# Patient Record
Sex: Male | Born: 1950 | Race: White | Hispanic: No | Marital: Married | Smoking: Former smoker
Health system: Southern US, Community
[De-identification: ages and names within clinical notes are randomized; demographics above are authoritative.]

## PROBLEM LIST (undated history)

## (undated) DIAGNOSIS — C9511 Chronic leukemia of unspecified cell type, in remission: Secondary | ICD-10-CM

## (undated) DIAGNOSIS — I10 Essential (primary) hypertension: Secondary | ICD-10-CM

## (undated) DIAGNOSIS — H532 Diplopia: Secondary | ICD-10-CM

## (undated) DIAGNOSIS — K219 Gastro-esophageal reflux disease without esophagitis: Secondary | ICD-10-CM

## (undated) DIAGNOSIS — G709 Myoneural disorder, unspecified: Secondary | ICD-10-CM

## (undated) DIAGNOSIS — M1990 Unspecified osteoarthritis, unspecified site: Secondary | ICD-10-CM

## (undated) HISTORY — PX: HIP SURGERY: SHX245

## (undated) HISTORY — DX: Diplopia: H53.2

## (undated) HISTORY — PX: LYMPH NODE BIOPSY: SHX201

## (undated) HISTORY — PX: EYE SURGERY: SHX253

---

## 2012-09-10 HISTORY — PX: ROTATOR CUFF REPAIR: SHX139

## 2013-06-28 ENCOUNTER — Encounter (HOSPITAL_COMMUNITY): Payer: Self-pay | Admitting: Pharmacy Technician

## 2013-06-28 ENCOUNTER — Other Ambulatory Visit: Payer: Self-pay | Admitting: Neurosurgery

## 2013-06-29 ENCOUNTER — Encounter (HOSPITAL_COMMUNITY): Payer: Self-pay

## 2013-06-29 ENCOUNTER — Ambulatory Visit (HOSPITAL_COMMUNITY)
Admission: RE | Admit: 2013-06-29 | Discharge: 2013-06-29 | Disposition: A | Discharge status: Home / Self Care | Payer: BC Managed Care – PPO | Source: Ambulatory Visit | Attending: Neurosurgery | Admitting: Neurosurgery

## 2013-06-29 ENCOUNTER — Encounter (HOSPITAL_COMMUNITY)
Admission: RE | Admit: 2013-06-29 | Discharge: 2013-06-29 | Disposition: A | Discharge status: Home / Self Care | Payer: BC Managed Care – PPO | Source: Ambulatory Visit | Attending: Neurosurgery | Admitting: Neurosurgery

## 2013-06-29 DIAGNOSIS — Z01812 Encounter for preprocedural laboratory examination: Secondary | ICD-10-CM | POA: Insufficient documentation

## 2013-06-29 DIAGNOSIS — Z87891 Personal history of nicotine dependence: Secondary | ICD-10-CM | POA: Insufficient documentation

## 2013-06-29 DIAGNOSIS — Z01810 Encounter for preprocedural cardiovascular examination: Secondary | ICD-10-CM | POA: Insufficient documentation

## 2013-06-29 DIAGNOSIS — I10 Essential (primary) hypertension: Secondary | ICD-10-CM | POA: Insufficient documentation

## 2013-06-29 DIAGNOSIS — M5126 Other intervertebral disc displacement, lumbar region: Secondary | ICD-10-CM | POA: Insufficient documentation

## 2013-06-29 HISTORY — DX: Unspecified osteoarthritis, unspecified site: M19.90

## 2013-06-29 HISTORY — DX: Essential (primary) hypertension: I10

## 2013-06-29 HISTORY — DX: Gastro-esophageal reflux disease without esophagitis: K21.9

## 2013-06-29 HISTORY — DX: Chronic leukemia of unspecified cell type, in remission: C95.11

## 2013-06-29 HISTORY — DX: Myoneural disorder, unspecified: G70.9

## 2013-06-29 LAB — CBC
HCT: 44.2 % (ref 39.0–52.0)
MCHC: 35.5 g/dL (ref 30.0–36.0)
MCV: 89.3 fL (ref 78.0–100.0)
Platelets: 205 10*3/uL (ref 150–400)
RDW: 13.5 % (ref 11.5–15.5)
WBC: 41.3 10*3/uL — ABNORMAL HIGH (ref 4.0–10.5)

## 2013-06-29 LAB — BASIC METABOLIC PANEL
BUN: 27 mg/dL — ABNORMAL HIGH (ref 6–23)
Calcium: 9.7 mg/dL (ref 8.4–10.5)
Chloride: 103 mEq/L (ref 96–112)
Creatinine, Ser: 1.05 mg/dL (ref 0.50–1.35)
GFR calc Af Amer: 85 mL/min — ABNORMAL LOW (ref >90)
GFR calc non Af Amer: 74 mL/min — ABNORMAL LOW (ref >90)

## 2013-06-29 NOTE — Pre-Procedure Instructions (Signed)
DAIVON RAYOS  06/29/2013   Your procedure is scheduled on:  July 01, 2013 at 6:06 PM  Report to [facility] at 4:00 PM.  Call this number if you have problems the morning of surgery: [phone]   Remember:   Do not eat food or drink liquids after midnight.   Take these medicines the morning of surgery with A SIP OF WATER: cetirizine (ZYRTEC), omeprazole (PRILOSEC) Stop all Vitamins, Herbal medications, Aspirin and non-steroidals (Aleve, Motrin, Advil, Ibuprofen, Naproxen) as of today.     Do not wear jewelry, make-up or nail polish.  Do not wear lotions, powders, or perfumes. You may wear deodorant.  Do not shave 48 hours prior to surgery. Men may shave face and neck.  Do not bring valuables to the hospital.  [facility] is not responsible                   for any belongings or valuables.  Contacts, dentures or bridgework may not be worn into surgery.  Leave suitcase in the car. After surgery it may be brought to your room.  For patients admitted to the hospital, checkout time is 11:00 AM the day of  discharge.     Special Instructions: Shower using CHG 2 nights before surgery and the night before surgery.  If you shower the day of surgery use CHG.  Use special wash - you have one bottle of CHG for all showers.  You should use approximately 1/3 of the bottle for each shower.   Please read over the following fact sheets that you were given: Pain Booklet, Coughing and Deep Breathing, MRSA Information and Surgical Site Infection Prevention

## 2013-06-30 ENCOUNTER — Encounter (HOSPITAL_COMMUNITY): Payer: Self-pay

## 2013-06-30 NOTE — Progress Notes (Addendum)
Anesthesia chart review:  Patient is a 62 year old male scheduled for one level lumbar laminectomy/decompression microdiscectomy on 07/01/13 by Dr. Wynetta Emery.  History includes former smoker, HTN, arthritis, GERD, right rotator cuff repair 09/2012, LLE radiculopathy, CLL stage I. PCP is listed as Dr. Montey Hora. HEM-ONC is Dr. Ivor Reining.  EKG on 06/29/13 showed NSR, ST elevation, consider early repolarization, J point elevation.  Borderline ECG.  I contacted his PCP office and received comparison EKGs and a stress test from 2012.  His EKg from 09/06/10 also showed probable early repolarization.  Exercise stress test (read by Dr. Bonnielee Haff) from 01/14/11 showed functional capacity of 11.7 METS, hypertensive BP at rest with exaggerated response, no chest pain or EKG changes at high workload.  Baseline EKG then showed: "Normal sinus rhythm.  Minimal J point elevation inferolateral leads: normal variant early repolarization."   CXR on 06/29/13 showed no active lung disease.  Preoperative labs noted.  WBC 41.3 (called to [facility] at Dr. Lonie Peak office ~ 10 AM). K+ 5.4, Cr 1.05.  Meds include lisinopril.  I received comparison labs from Dr. Allyne Gee office.  Patient has known CLL, but his WBC on 05/05/13 was 17.5, but notes indicate that "it goes up and down with him."  H/H 14.9/42.1, K+ 4.5.  I couldn't reach patient at home, but Dr. Lonie Peak office did report that patient has received two epidural injections by Dr. Mariel Craft on 06/13/13 and 06/21/13.  I have faxed labs over for Dr. Allyne Gee to review and contact me if any concerns. I reviewed with anesthesiologist Dr. Katrinka Blazing who felt patient's leukocytosis in a patient with CLL would not necessarily prevent surgery (if otherwise no acute s/s infection) from an anesthesia standpoint, but that if Dr. Wynetta Emery had any concerns he should call and speak with Dr. Allyne Gee.  Vanessa notified.  Velna Ochs [facility]/Anesthesiology Phone [phone] 06/30/2013 4:26 PM  Addendum: 07/01/2013 1:23 PM I just received a phone call back from patient's Oncologist Dr. Allyne Gee.  He has reviewed labs.  I did notify him of patients steroid injections earlier this month which is possibly exacerbating his elevated WBC.  Dr. Allyne Gee feels labs results should not interfere with patient proceeding to the OR, as long as he appears "well" and without new symptoms from his baseline.  He would like to see patient within ~ one month following surgery.

## 2013-07-01 ENCOUNTER — Ambulatory Visit (HOSPITAL_COMMUNITY): Payer: BC Managed Care – PPO

## 2013-07-01 ENCOUNTER — Encounter (HOSPITAL_COMMUNITY)
Admission: RE | Disposition: A | Discharge status: Home / Self Care | Payer: Self-pay | Source: Ambulatory Visit | Attending: Neurosurgery

## 2013-07-01 ENCOUNTER — Inpatient Hospital Stay (HOSPITAL_COMMUNITY)
Admission: RE | Admit: 2013-07-01 | Discharge: 2013-07-02 | DRG: 757 | Disposition: A | Discharge status: Home / Self Care | Payer: BC Managed Care – PPO | Source: Ambulatory Visit | Attending: Neurosurgery | Admitting: Neurosurgery

## 2013-07-01 ENCOUNTER — Encounter (HOSPITAL_COMMUNITY): Payer: Self-pay | Admitting: Vascular Surgery

## 2013-07-01 ENCOUNTER — Ambulatory Visit (HOSPITAL_COMMUNITY): Payer: BC Managed Care – PPO | Admitting: Anesthesiology

## 2013-07-01 ENCOUNTER — Encounter (HOSPITAL_COMMUNITY): Payer: Self-pay | Admitting: *Deleted

## 2013-07-01 DIAGNOSIS — D809 Immunodeficiency with predominantly antibody defects, unspecified: Secondary | ICD-10-CM | POA: Diagnosis present

## 2013-07-01 DIAGNOSIS — C9511 Chronic leukemia of unspecified cell type, in remission: Secondary | ICD-10-CM | POA: Diagnosis present

## 2013-07-01 DIAGNOSIS — K219 Gastro-esophageal reflux disease without esophagitis: Secondary | ICD-10-CM | POA: Diagnosis present

## 2013-07-01 DIAGNOSIS — I10 Essential (primary) hypertension: Secondary | ICD-10-CM | POA: Diagnosis present

## 2013-07-01 DIAGNOSIS — M5126 Other intervertebral disc displacement, lumbar region: Principal | ICD-10-CM | POA: Diagnosis present

## 2013-07-01 DIAGNOSIS — Z87891 Personal history of nicotine dependence: Secondary | ICD-10-CM

## 2013-07-01 DIAGNOSIS — M129 Arthropathy, unspecified: Secondary | ICD-10-CM | POA: Diagnosis present

## 2013-07-01 HISTORY — PX: LUMBAR LAMINECTOMY/DECOMPRESSION MICRODISCECTOMY: SHX5026

## 2013-07-01 SURGERY — LUMBAR LAMINECTOMY/DECOMPRESSION MICRODISCECTOMY 1 LEVEL
Anesthesia: General | Site: Back | Laterality: Left | Wound class: Clean

## 2013-07-01 MED ORDER — THROMBIN 5000 UNITS EX SOLR
CUTANEOUS | Status: DC | PRN
  Administered 2013-07-01 (×2): 5000 [IU] via TOPICAL

## 2013-07-01 MED ORDER — SELENIUM 200 MCG PO CAPS
200.0000 ug | ORAL_CAPSULE | Freq: Every day | ORAL | Status: DC
Start: 2013-07-01 — End: 2013-07-01

## 2013-07-01 MED ORDER — ACETAMINOPHEN 325 MG PO TABS: 650.0000 mg | ORAL_TABLET | ORAL | Status: DC | PRN

## 2013-07-01 MED ORDER — NEOSTIGMINE METHYLSULFATE 1 MG/ML IJ SOLN
INTRAMUSCULAR | Status: DC | PRN
  Administered 2013-07-01: 4 mg via INTRAVENOUS

## 2013-07-01 MED ORDER — CYCLOBENZAPRINE HCL 10 MG PO TABS
10.0000 mg | ORAL_TABLET | Freq: Three times a day (TID) | ORAL | Status: DC | PRN
  Administered 2013-07-02: 10 mg via ORAL
  Filled 2013-07-01: qty 1

## 2013-07-01 MED ORDER — ALUM & MAG HYDROXIDE-SIMETH 200-200-20 MG/5ML PO SUSP
30.0000 mL | Freq: Four times a day (QID) | ORAL | Status: DC | PRN
  Administered 2013-07-02 (×2): 30 mL via ORAL
  Filled 2013-07-01 (×2): qty 30

## 2013-07-01 MED ORDER — FENTANYL CITRATE 0.05 MG/ML IJ SOLN
INTRAMUSCULAR | Status: DC | PRN
  Administered 2013-07-01: 250 ug via INTRAVENOUS
  Administered 2013-07-01: 100 ug via INTRAVENOUS
  Administered 2013-07-01: 50 ug via INTRAVENOUS

## 2013-07-01 MED ORDER — BUPIVACAINE HCL (PF) 0.25 % IJ SOLN
INTRAMUSCULAR | Status: DC | PRN
  Administered 2013-07-01: 8 mL

## 2013-07-01 MED ORDER — LIDOCAINE-EPINEPHRINE 1 %-1:100000 IJ SOLN
INTRAMUSCULAR | Status: DC | PRN
  Administered 2013-07-01: 8 mL via INTRADERMAL

## 2013-07-01 MED ORDER — PANTOPRAZOLE SODIUM 40 MG PO TBEC
40.0000 mg | DELAYED_RELEASE_TABLET | Freq: Every day | ORAL | Status: DC
  Administered 2013-07-01: 40 mg via ORAL
  Filled 2013-07-01 (×2): qty 1

## 2013-07-01 MED ORDER — ONDANSETRON HCL 4 MG/2ML IJ SOLN
INTRAMUSCULAR | Status: AC
  Filled 2013-07-01: qty 2

## 2013-07-01 MED ORDER — LACTATED RINGERS IV SOLN
INTRAVENOUS | Status: DC | PRN
  Administered 2013-07-01 (×2): via INTRAVENOUS

## 2013-07-01 MED ORDER — GLYCOPYRROLATE 0.2 MG/ML IJ SOLN
INTRAMUSCULAR | Status: DC | PRN
  Administered 2013-07-01: .8 mg via INTRAVENOUS

## 2013-07-01 MED ORDER — CEFAZOLIN SODIUM-DEXTROSE 2-3 GM-% IV SOLR
INTRAVENOUS | Status: AC
  Administered 2013-07-01: 2 g via INTRAVENOUS
  Filled 2013-07-01: qty 50

## 2013-07-01 MED ORDER — OXYCODONE HCL 5 MG PO TABS: 5.0000 mg | ORAL_TABLET | Freq: Once | ORAL | Status: DC | PRN

## 2013-07-01 MED ORDER — OXYCODONE HCL 5 MG/5ML PO SOLN: 5.0000 mg | Freq: Once | ORAL | Status: DC | PRN

## 2013-07-01 MED ORDER — DICLOFENAC SODIUM 75 MG PO TBEC
75.0000 mg | DELAYED_RELEASE_TABLET | Freq: Two times a day (BID) | ORAL | Status: DC | PRN
  Administered 2013-07-02: 75 mg via ORAL
  Filled 2013-07-01: qty 1

## 2013-07-01 MED ORDER — ONDANSETRON HCL 4 MG/2ML IJ SOLN
INTRAMUSCULAR | Status: DC | PRN
  Administered 2013-07-01: 4 mg via INTRAVENOUS

## 2013-07-01 MED ORDER — SODIUM CHLORIDE 0.9 % IV SOLN: 250.0000 mL | INTRAVENOUS | Status: DC

## 2013-07-01 MED ORDER — ADULT MULTIVITAMIN W/MINERALS CH
1.0000 | ORAL_TABLET | Freq: Every day | ORAL | Status: DC
  Administered 2013-07-01 – 2013-07-02 (×2): 1 via ORAL
  Filled 2013-07-01 (×2): qty 1

## 2013-07-01 MED ORDER — PROPOFOL 10 MG/ML IV BOLUS
INTRAVENOUS | Status: DC | PRN
  Administered 2013-07-01: 150 mg via INTRAVENOUS

## 2013-07-01 MED ORDER — SODIUM CHLORIDE 0.9 % IR SOLN
Status: DC | PRN
  Administered 2013-07-01: 17:00:00

## 2013-07-01 MED ORDER — HYDROMORPHONE HCL PF 1 MG/ML IJ SOLN
0.5000 mg | INTRAMUSCULAR | Status: DC | PRN
  Administered 2013-07-02: 1 mg via INTRAVENOUS
  Filled 2013-07-01: qty 1

## 2013-07-01 MED ORDER — OXYCODONE-ACETAMINOPHEN 5-325 MG PO TABS
ORAL_TABLET | ORAL | Status: AC
  Filled 2013-07-01: qty 2

## 2013-07-01 MED ORDER — SODIUM CHLORIDE 0.9 % IJ SOLN: 3.0000 mL | INTRAMUSCULAR | Status: DC | PRN

## 2013-07-01 MED ORDER — LISINOPRIL 10 MG PO TABS
10.0000 mg | ORAL_TABLET | Freq: Every day | ORAL | Status: DC
  Administered 2013-07-01 – 2013-07-02 (×2): 10 mg via ORAL
  Filled 2013-07-01 (×2): qty 1

## 2013-07-01 MED ORDER — HYDROMORPHONE HCL PF 1 MG/ML IJ SOLN
INTRAMUSCULAR | Status: AC
  Filled 2013-07-01: qty 1

## 2013-07-01 MED ORDER — HYDROMORPHONE HCL PF 1 MG/ML IJ SOLN: 0.2500 mg | INTRAMUSCULAR | Status: DC | PRN

## 2013-07-01 MED ORDER — PROMETHAZINE HCL 25 MG/ML IJ SOLN
INTRAMUSCULAR | Status: AC
  Administered 2013-07-01: 12.5 mg
  Filled 2013-07-01: qty 1

## 2013-07-01 MED ORDER — LORATADINE 10 MG PO TABS
10.0000 mg | ORAL_TABLET | Freq: Every day | ORAL | Status: DC
  Administered 2013-07-01 – 2013-07-02 (×2): 10 mg via ORAL
  Filled 2013-07-01 (×2): qty 1

## 2013-07-01 MED ORDER — PHENOL 1.4 % MT LIQD
1.0000 | OROMUCOSAL | Status: DC | PRN
  Administered 2013-07-01: 1 via OROMUCOSAL
  Filled 2013-07-01: qty 177

## 2013-07-01 MED ORDER — CYCLOBENZAPRINE HCL 10 MG PO TABS
ORAL_TABLET | ORAL | Status: AC
  Filled 2013-07-01: qty 1

## 2013-07-01 MED ORDER — ARTIFICIAL TEARS OP OINT
TOPICAL_OINTMENT | OPHTHALMIC | Status: DC | PRN
  Administered 2013-07-01: 1 via OPHTHALMIC

## 2013-07-01 MED ORDER — SODIUM CHLORIDE 0.9 % IJ SOLN: 3.0000 mL | Freq: Two times a day (BID) | INTRAMUSCULAR | Status: DC

## 2013-07-01 MED ORDER — MENTHOL 3 MG MT LOZG
1.0000 | LOZENGE | OROMUCOSAL | Status: DC | PRN
  Administered 2013-07-01: 3 mg via ORAL
  Filled 2013-07-01: qty 9

## 2013-07-01 MED ORDER — OXYCODONE-ACETAMINOPHEN 5-325 MG PO TABS: 1.0000 | ORAL_TABLET | ORAL | Status: DC | PRN

## 2013-07-01 MED ORDER — HEMOSTATIC AGENTS (NO CHARGE) OPTIME
TOPICAL | Status: DC | PRN
  Administered 2013-07-01: 1 via TOPICAL

## 2013-07-01 MED ORDER — LIDOCAINE HCL (CARDIAC) 20 MG/ML IV SOLN
INTRAVENOUS | Status: DC | PRN
  Administered 2013-07-01: 50 mg via INTRAVENOUS

## 2013-07-01 MED ORDER — SELENIUM 50 MCG PO TABS
200.0000 ug | ORAL_TABLET | Freq: Every day | ORAL | Status: DC
  Administered 2013-07-02: 200 ug via ORAL
  Filled 2013-07-01: qty 4

## 2013-07-01 MED ORDER — ROCURONIUM BROMIDE 100 MG/10ML IV SOLN
INTRAVENOUS | Status: DC | PRN
  Administered 2013-07-01: 50 mg via INTRAVENOUS

## 2013-07-01 MED ORDER — PROMETHAZINE HCL 25 MG/ML IJ SOLN: 6.2500 mg | INTRAMUSCULAR | Status: DC | PRN

## 2013-07-01 MED ORDER — ONDANSETRON HCL 4 MG/2ML IJ SOLN: 4.0000 mg | INTRAMUSCULAR | Status: DC | PRN

## 2013-07-01 MED ORDER — HYDROCODONE-ACETAMINOPHEN 10-325 MG PO TABS
1.0000 | ORAL_TABLET | Freq: Three times a day (TID) | ORAL | Status: DC | PRN
  Administered 2013-07-02 (×2): 1 via ORAL
  Filled 2013-07-01 (×2): qty 1

## 2013-07-01 MED ORDER — LACTATED RINGERS IV SOLN
INTRAVENOUS | Status: DC
  Administered 2013-07-01: 50 mL/h via INTRAVENOUS

## 2013-07-01 MED ORDER — 0.9 % SODIUM CHLORIDE (POUR BTL) OPTIME
TOPICAL | Status: DC | PRN
  Administered 2013-07-01: 1000 mL

## 2013-07-01 MED ORDER — CEFAZOLIN SODIUM 1-5 GM-% IV SOLN
1.0000 g | Freq: Three times a day (TID) | INTRAVENOUS | Status: AC
  Administered 2013-07-01 – 2013-07-02 (×2): 1 g via INTRAVENOUS
  Filled 2013-07-01 (×3): qty 50

## 2013-07-01 MED ORDER — DOCUSATE SODIUM 100 MG PO CAPS
100.0000 mg | ORAL_CAPSULE | Freq: Two times a day (BID) | ORAL | Status: DC
  Administered 2013-07-01 – 2013-07-02 (×2): 100 mg via ORAL
  Filled 2013-07-01 (×2): qty 1

## 2013-07-01 MED ORDER — ACETAMINOPHEN 650 MG RE SUPP: 650.0000 mg | RECTAL | Status: DC | PRN

## 2013-07-01 MED ORDER — PROPOFOL 10 MG/ML IV BOLUS: INTRAVENOUS | Status: DC | PRN

## 2013-07-01 SURGICAL SUPPLY — 51 items
BAG DECANTER FOR FLEXI CONT (MISCELLANEOUS) ×2 IMPLANT
BENZOIN TINCTURE PRP APPL 2/3 (GAUZE/BANDAGES/DRESSINGS) ×2 IMPLANT
BLADE SURG 11 STRL SS (BLADE) ×2 IMPLANT
BLADE SURG ROTATE 9660 (MISCELLANEOUS) IMPLANT
BRUSH SCRUB EZ PLAIN DRY (MISCELLANEOUS) ×2 IMPLANT
BUR MATCHSTICK NEURO 3.0 LAGG (BURR) ×2 IMPLANT
BUR PRECISION FLUTE 6.0 (BURR) ×2 IMPLANT
CANISTER SUCTION 2500CC (MISCELLANEOUS) ×2 IMPLANT
CLOTH BEACON ORANGE TIMEOUT ST (SAFETY) ×2 IMPLANT
CONT SPEC 4OZ CLIKSEAL STRL BL (MISCELLANEOUS) ×2 IMPLANT
DECANTER SPIKE VIAL GLASS SM (MISCELLANEOUS) ×2 IMPLANT
DERMABOND ADVANCED (GAUZE/BANDAGES/DRESSINGS) ×1
DERMABOND ADVANCED .7 DNX12 (GAUZE/BANDAGES/DRESSINGS) ×1 IMPLANT
DRAPE LAPAROTOMY 100X72X124 (DRAPES) ×2 IMPLANT
DRAPE MICROSCOPE ZEISS OPMI (DRAPES) ×2 IMPLANT
DRAPE POUCH INSTRU U-SHP 10X18 (DRAPES) ×2 IMPLANT
DRAPE PROXIMA HALF (DRAPES) IMPLANT
DRAPE SURG 17X23 STRL (DRAPES) ×2 IMPLANT
DRSG OPSITE 4X5.5 SM (GAUZE/BANDAGES/DRESSINGS) ×2 IMPLANT
DRSG OPSITE POSTOP 4X6 (GAUZE/BANDAGES/DRESSINGS) ×2 IMPLANT
DURAPREP 26ML APPLICATOR (WOUND CARE) ×2 IMPLANT
ELECT REM PT RETURN 9FT ADLT (ELECTROSURGICAL) ×2
ELECTRODE REM PT RTRN 9FT ADLT (ELECTROSURGICAL) ×1 IMPLANT
GAUZE SPONGE 4X4 16PLY XRAY LF (GAUZE/BANDAGES/DRESSINGS) IMPLANT
GLOVE BIO SURGEON STRL SZ8 (GLOVE) ×2 IMPLANT
GLOVE EXAM NITRILE LRG STRL (GLOVE) IMPLANT
GLOVE EXAM NITRILE MD LF STRL (GLOVE) IMPLANT
GLOVE EXAM NITRILE XL STR (GLOVE) IMPLANT
GLOVE EXAM NITRILE XS STR PU (GLOVE) IMPLANT
GLOVE INDICATOR 8.5 STRL (GLOVE) ×2 IMPLANT
GOWN BRE IMP SLV AUR LG STRL (GOWN DISPOSABLE) ×2 IMPLANT
GOWN BRE IMP SLV AUR XL STRL (GOWN DISPOSABLE) ×4 IMPLANT
GOWN STRL REIN 2XL LVL4 (GOWN DISPOSABLE) IMPLANT
KIT BASIN OR (CUSTOM PROCEDURE TRAY) ×2 IMPLANT
KIT ROOM TURNOVER OR (KITS) ×2 IMPLANT
NEEDLE HYPO 22GX1.5 SAFETY (NEEDLE) ×2 IMPLANT
NEEDLE SPNL 22GX3.5 QUINCKE BK (NEEDLE) ×2 IMPLANT
NS IRRIG 1000ML POUR BTL (IV SOLUTION) ×2 IMPLANT
PACK LAMINECTOMY NEURO (CUSTOM PROCEDURE TRAY) ×2 IMPLANT
RUBBERBAND STERILE (MISCELLANEOUS) ×4 IMPLANT
SPONGE GAUZE 4X4 12PLY (GAUZE/BANDAGES/DRESSINGS) ×2 IMPLANT
SPONGE SURGIFOAM ABS GEL SZ50 (HEMOSTASIS) ×2 IMPLANT
STRIP CLOSURE SKIN 1/2X4 (GAUZE/BANDAGES/DRESSINGS) ×2 IMPLANT
SUT VIC AB 0 CT1 18XCR BRD8 (SUTURE) ×1 IMPLANT
SUT VIC AB 0 CT1 8-18 (SUTURE) ×1
SUT VIC AB 2-0 CT1 18 (SUTURE) ×2 IMPLANT
SUT VICRYL 4-0 PS2 18IN ABS (SUTURE) ×2 IMPLANT
SYR 20ML ECCENTRIC (SYRINGE) ×2 IMPLANT
TOWEL OR 17X24 6PK STRL BLUE (TOWEL DISPOSABLE) ×2 IMPLANT
TOWEL OR 17X26 10 PK STRL BLUE (TOWEL DISPOSABLE) ×2 IMPLANT
WATER STERILE IRR 1000ML POUR (IV SOLUTION) ×2 IMPLANT

## 2013-07-01 NOTE — Transfer of Care (Signed)
Immediate Anesthesia Transfer of Care Note  Patient: Grant Carter  Procedure(s) Performed: Procedure(s) with comments: LUMBAR LAMINECTOMY/DECOMPRESSION MICRODISCECTOMY 1 LEVEL (Left) - Lumbar Laminectomy and Discectomy Lumbar Two-Three   Patient Location: PACU  Anesthesia Type:General  Level of Consciousness: awake and alert   Airway & Oxygen Therapy: Patient Spontanous Breathing and Patient connected to nasal cannula oxygen  Post-op Assessment: Report given to PACU RN and Post -op Vital signs reviewed and stable  Post vital signs: Reviewed and stable  Complications: No apparent anesthesia complications

## 2013-07-01 NOTE — Op Note (Signed)
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  EBL: Minimal  History of present illness: patient is a very pleasant 62 year old gentleman who presented with acute onset of left leg pain and weakness probably has quads iliopsoas consistent with an L3 radiculopathy. Workup with a large disc herniation L2-3 with a large free fragment migrated caudally behind the L3 vertebral body displacing the L3 nerve at its pedicle. Due to patient's clinical exam consistent with significant weakness in the L3 nerve distribution as well as failure conservative treatment imaging findings I recommended laminectomy microscope at that level I extensively went over the risks and benefits of the operation with him as well as perioperative course expectations alternatives of surgery and he understands and agrees to proceed forward.  Operative procedure: Patient brought into the or was induced and general anesthesia positioned prone on the Wilson frame his back was prepped and draped in routine sterile fashion preoperative localizing appropriate level so after infiltration of 10 cc lidocaine with epi a midline incision was made and Bovie light cautery was used to gas exchange tissues subperiosteal dissections care lamina of L2 and L3 on the left interoperative x-ray confirmed the appropriate level so a high-speed drill was drilled down the medial facet complex and dressed L2 and suppressant lamina of L3. Laminotomy was begun with a 20 minute Kerrison punch. The ligament was identified and removed piece of fashion exposing the thecal sac. And the microscope illumination the L3 nerve was identified and L3 pedicles identified under biting the medial gutter  allowed identification of the medial border of the L3 pedicle and to nerve hook and working underneath the L3 nerve several large free fragments disc removed we removed medial to the pedicle and inferior. Her after removal of several large fragments testing the disc space this was cleaned up to 2 rongeurs Epstein curettes additional large fracture removed from the lateral compartment and the disc space but again the discectomy there is no further stenosis on thecal sac or L3 nerve root the foramen easily excepting a coronary dilator and hockey-stick. Wounds and copiously irrigated meticulous in space was maintained Gelfoam was overlaid top of the dura the muscle fascia pressure layers with after Vicryl and the skin was closed running 4 subcuticular. Benzoin and Steri-Strips were applied patient recovered in stable condition. At the end of case on it counts sponge counts were correct.

## 2013-07-01 NOTE — H&P (Signed)
Grant Carter is an 62 y.o. male.   Chief Complaint: Back and left leg pain HPI: Patient is a very pleasant 65 year old son who has had severe back and pain rate as left leg down his anterior quad with numbness tingling same distribution and weakness and proximal leg. The pain is getting better however the numbness and weakness has progressed. Workup has revealed a very large disc herniation with a free fragment migrating caudally displacing the L3 nerve against the L3 pedicle. And due to patient's clinical exam a progressive weakness MRI findings and failure conservative treatment I recommended lumbar laminectomy microdiscectomy I extensively reviewed the risks and benefits of the operation the patient as well as therapy course expectations about alternatives of surgery and he understood and agreed to proceed forward to  Past Medical History  Diagnosis Date  . Hypertension     treated for fluctuating BP  . GERD (gastroesophageal reflux disease)   . Neuromuscular disorder     nerve pain, numbness in left leg/foot  . Arthritis   . 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 Ivor Reining)    Past Surgical History  Procedure Laterality Date  . Hip surgery      as a child  . Rotator cuff repair Right 09/2012  . Eye surgery Left     cataract  . Lymph node biopsy Right     History reviewed. No pertinent family history. Social History:  reports that he has quit smoking. He has never used smokeless tobacco. He reports that  drinks alcohol. He reports that he does not use illicit drugs.  Allergies: No Known Allergies  Medications Prior to Admission  Medication Sig Dispense Refill  . cetirizine (ZYRTEC) 10 MG tablet Take 10 mg by mouth daily.      . diclofenac (VOLTAREN) 75 MG EC tablet Take 75 mg by mouth 2 (two) times daily as needed (pain).      Marland Kitchen HYDROcodone-acetaminophen (NORCO) 10-325 MG per tablet Take 1 tablet by mouth 3 (three) times daily as needed  for pain.      Marland Kitchen lisinopril (PRINIVIL,ZESTRIL) 10 MG tablet Take 10 mg by mouth daily.      . Multiple Vitamin (MULTIVITAMIN WITH MINERALS) TABS tablet Take 1 tablet by mouth daily.      Marland Kitchen omeprazole (PRILOSEC) 40 MG capsule Take 40 mg by mouth daily as needed (heartburn).      . Selenium 200 MCG CAPS Take 200 mcg by mouth daily.        No results found for this or any previous visit (from the past 48 hour(s)). No results found.  Review of Systems  Constitutional: Negative.   HENT: Negative.   Eyes: Negative.   Respiratory: Negative.   Cardiovascular: Negative.   Gastrointestinal: Negative.   Genitourinary: Negative.   Musculoskeletal: Positive for myalgias and back pain.  Skin: Negative.   Neurological: Positive for tingling and sensory change.  Endo/Heme/Allergies: Negative.   Psychiatric/Behavioral: Negative.     Blood pressure 184/97, pulse 67, temperature 97.9 F (36.6 C), temperature source Oral, resp. rate 20, SpO2 98.00%. Physical Exam  Constitutional: He is oriented to person, place, and time. He appears well-developed and well-nourished.  Eyes: Pupils are equal, round, and reactive to light.  Neck: Normal range of motion.  Cardiovascular: Normal rate and regular rhythm.   Respiratory: Effort normal.  GI: Soft.  Musculoskeletal: Normal range of motion.  Neurological: He is alert and oriented to person, place,  and time. GCS eye subscore is 4. GCS verbal subscore is 5. GCS motor subscore is 6.  Reflex Scores:      Patellar reflexes are 0 on the right side and 0 on the left side.      Achilles reflexes are 0 on the right side and 0 on the left side. Strength in his right lower extremity is 5 out of 5 in his iliopsoas, quads, hip she's, gaseous, and EHL. Straight the [place] left lower extremity has weakness in his quadriceps and hip flexors at about 4/5 in her left leg distally he is 5 out of 5     Assessment/Plan 62 year old presents for lumbar laminectomy  microdiscectomy L2-3 left  Grant Carter P 07/01/2013, 4:39 PM

## 2013-07-01 NOTE — Anesthesia Preprocedure Evaluation (Addendum)
Anesthesia Evaluation  Patient identified by MRN, date of birth, ID band Patient awake    Reviewed: Allergy & Precautions, H&P , NPO status , Patient's Chart, lab work & pertinent test results  History of Anesthesia Complications Negative for: history of anesthetic complications  Airway Mallampati: II TM Distance: >3 FB Neck ROM: Full    Dental  (+) Teeth Intact and Dental Advisory Given   Pulmonary neg pulmonary ROS,    Pulmonary exam normal       Cardiovascular hypertension, Pt. on medications  Exercise stress test (read by Dr. Bonnielee Haff) from 01/14/11 showed functional capacity of 11.7 METS, hypertensive BP at rest with exaggerated response, no chest pain or EKG changes at high workload.  Baseline EKG then showed: "Normal sinus rhythm.  Minimal J point elevation inferolateral leads: normal variant early repolarization."     Neuro/Psych negative psych ROS   GI/Hepatic Neg liver ROS, GERD-  Medicated,  Endo/Other  negative endocrine ROS  Renal/GU negative Renal ROS     Musculoskeletal negative musculoskeletal ROS (+)   Abdominal   Peds  Hematology  (+) Blood dyscrasia, , CLL in remission   Anesthesia Other Findings   Reproductive/Obstetrics                         Anesthesia Physical Anesthesia Plan  ASA: II  Anesthesia Plan: General   Post-op Pain Management:    Induction: Intravenous  Airway Management Planned: Oral ETT  Additional Equipment:   Intra-op Plan:   Post-operative Plan: Extubation in OR  Informed Consent: I have reviewed the patients History and Physical, chart, labs and discussed the procedure including the risks, benefits and alternatives for the proposed anesthesia with the patient or authorized representative who has indicated his/her understanding and acceptance.   Dental advisory given  Plan Discussed with: CRNA, Anesthesiologist and  Surgeon  Anesthesia Plan Comments:         Anesthesia Quick Evaluation

## 2013-07-01 NOTE — Preoperative (Signed)
Beta Blockers   Reason not to administer Beta Blockers:Not Applicable 

## 2013-07-01 NOTE — Plan of Care (Signed)
Problem: Consults Goal: Diagnosis - Spinal Surgery Outcome: Completed/Met Date Met:  07/01/13 Lumbar Laminectomy (Complex)

## 2013-07-01 NOTE — Anesthesia Postprocedure Evaluation (Signed)
  Anesthesia Post-op Note  Patient: Grant Carter  Procedure(s) Performed: Procedure(s) with comments: LUMBAR LAMINECTOMY/DECOMPRESSION MICRODISCECTOMY 1 LEVEL (Left) - Lumbar Laminectomy and Discectomy Lumbar Two-Three   Patient Location: PACU  Anesthesia Type:General  Level of Consciousness: awake and alert   Airway and Oxygen Therapy: Patient Spontanous Breathing  Post-op Pain: mild  Post-op Assessment: Post-op Vital signs reviewed, Patient's Cardiovascular Status Stable, Respiratory Function Stable, Patent Airway, No signs of Nausea or vomiting and Pain level controlled  Post-op Vital Signs: stable  Complications: No apparent anesthesia complications

## 2013-07-01 NOTE — Anesthesia Procedure Notes (Signed)
Procedure Name: Intubation Date/Time: 07/01/2013 4:48 PM Performed by: Gwenyth Allegra Pre-anesthesia Checklist: Patient identified, Timeout performed, Emergency Drugs available, Suction available and Patient being monitored Patient Re-evaluated:Patient Re-evaluated prior to inductionOxygen Delivery Method: Circle system utilized Preoxygenation: Pre-oxygenation with 100% oxygen Intubation Type: IV induction Ventilation: Mask ventilation without difficulty and Oral airway inserted - appropriate to patient size Laryngoscope Size: 4 Grade View: Grade I Tube type: Oral Tube size: 8.0 mm Number of attempts: 1 Airway Equipment and Method: Stylet Placement Confirmation: ETT inserted through vocal cords under direct vision,  breath sounds checked- equal and bilateral and positive ETCO2 Secured at: 22 cm Tube secured with: Tape Dental Injury: Teeth and Oropharynx as per pre-operative assessment

## 2013-07-02 MED ORDER — TAMSULOSIN HCL 0.4 MG PO CAPS
0.4000 mg | ORAL_CAPSULE | Freq: Every morning | ORAL | Status: DC
  Administered 2013-07-02: 0.4 mg via ORAL
  Filled 2013-07-02: qty 1

## 2013-07-02 NOTE — Discharge Summary (Signed)
  Physician Discharge Summary  Patient ID: Grant Carter MRN: [id number] DOB/AGE: 63-Aug-1952 63 y.o.  Admit date: 07/01/2013 Discharge date: 07/02/2013  Admission Diagnoses: Left L3 radiculopathy from large ruptured disc L2-3 left  Discharge Diagnoses: Same Active Problems:   * No active hospital problems. *   Discharged Condition: good  Hospital Course: Patient was admitted to the hospital underwent the aforementioned procedure patient did very well recovered in the floor on the floor he was convalescing well ambulating but having some difficulty voiding. Should he be able to start voiding later on today he can be discharged otherwise he'll be observed with an in and out catheterization and possible placement of a Foley catheter. I have initiated Flomax.  Consults: Significant Diagnostic Studies: Treatments: L2-3 laminectomy and discectomy Discharge Exam: Blood pressure 139/84, pulse 65, temperature 98.6 F (37 C), temperature source Oral, resp. rate 18, SpO2 99.00%. Strength out of 5 wound clean dry  Disposition: Home     Medication List         cetirizine 10 MG tablet  Commonly known as:  ZYRTEC  Take 10 mg by mouth daily.     diclofenac 75 MG EC tablet  Commonly known as:  VOLTAREN  Take 75 mg by mouth 2 (two) times daily as needed (pain).     HYDROcodone-acetaminophen 10-325 MG per tablet  Commonly known as:  NORCO  Take 1 tablet by mouth 3 (three) times daily as needed for pain.     lisinopril 10 MG tablet  Commonly known as:  PRINIVIL,ZESTRIL  Take 10 mg by mouth daily.     multivitamin with minerals Tabs tablet  Take 1 tablet by mouth daily.     omeprazole 40 MG capsule  Commonly known as:  PRILOSEC  Take 40 mg by mouth daily as needed (heartburn).     Selenium 200 MCG Caps  Take 200 mcg by mouth daily.           Follow-up Information   Follow up with [facility] P, MD.   Specialty:  Neurosurgery   Contact information:   [address]  [address] [place] [address] [phone]       Signed: Ollie Esty P 07/02/2013, 7:56 AM

## 2013-07-02 NOTE — Progress Notes (Signed)
Pt transferred to 4N, report given to [place], Charity fundraiser. Rema Fendt, RN

## 2013-07-02 NOTE — Progress Notes (Signed)
Subjective: Patient reports Overall is doing well no leg pain still has some numbness he is ambulating and may be feels a little bit stronger in the leg.  Objective: Vital signs in last 24 hours: Temp:  [97.3 F (36.3 C)-98.6 F (37 C)] 98.6 F (37 C) (08/23 0400) Pulse Rate:  [65-76] 65 (08/23 0400) Resp:  [9-22] 18 (08/23 0400) BP: (136-184)/(68-97) 139/84 mmHg (08/23 0400) SpO2:  [92 %-100 %] 99 % (08/23 0400)  Intake/Output from previous day: 08/22 0701 - 08/23 0700 In: 1500 [I.V.:1500] Out: 1525 [Urine:1300; Blood:225] Intake/Output this shift:    Decreased sensation L3 strength 5 out of 5 wound clean and dry  Lab Results:  Recent Labs  06/29/13 1013  WBC 41.3*  HGB 15.7  HCT 44.2  PLT 205   BMET  Recent Labs  06/29/13 1006  NA 137  K 5.4*  CL 103  CO2 26  GLUCOSE 100*  BUN 27*  CREATININE 1.05  CALCIUM 9.7    Studies/Results: Dg Lumbar Spine 2-3 Views  07/01/2013   *RADIOLOGY REPORT*  Clinical Data: Multiple lumbar disc herniations.  OPERATIVE LUMBAR SPINE - 2-3 VIEW  Comparison: MRI lumbar spine 06/25/2013 [facility].  Findings: The same numbering scheme will be utilized as on the prior MRI, with the last lumbar segment designated L5.  Images were submitted for interpretation post-operatively.  Image labeled #1 obtained at 1705 hours demonstrates a localizer needle adjacent to the L3 spinous process.  Image labeled #2 at 1709 hours demonstrates tissue spreaders with a localizing device directed toward the L2-3 disc space, located posterior to the upper endplate of L2.  IMPRESSION: L2-3 localized intraoperatively.   Original Report Authenticated By: Hulan Saas, M.D.    Assessment/Plan: Posterior day 1 from a laminectomy discectomy doing fairly well. No leg pain no new numbness or tingling stable for discharge except he is not voiding it so will observe until about 10:00 and another opportunity void we'll initiate Flomax. Should he be unable to  void we'll transfer him to 4 N. 4 observation and once his voiding he'll be stable for discharge.  LOS: 1 day     Jhair Witherington P 07/02/2013, 7:53 AM

## 2013-07-02 NOTE — Plan of Care (Signed)
Problem: Discharge Progression Outcomes Goal: Discharge plan in place and appropriate Outcome: Completed/Met Date Met:  07/02/13 Patient to follow up with Dr. Wynetta Emery in next few weeks.  Will call on Monday for appointment. Goal: Pain controlled with appropriate interventions Outcome: Completed/Met Date Met:  07/02/13 Patient verbalizing relief with current medication regimen. Goal: Hemodynamically stable Outcome: Completed/Met Date Met:  07/02/13 Vitals within normal limits. Goal: Incision without S/S infection Outcome: Completed/Met Date Met:  07/02/13 Incision is free from s/s of infection. Goal: Ambulates without assistance Outcome: Completed/Met Date Met:  07/02/13 Patient able to ambulate freely with minimal standby assist.

## 2013-07-04 ENCOUNTER — Encounter (HOSPITAL_COMMUNITY): Payer: Self-pay | Admitting: Neurosurgery

## 2013-12-02 ENCOUNTER — Other Ambulatory Visit: Payer: Self-pay | Admitting: Neurosurgery

## 2013-12-08 ENCOUNTER — Encounter (HOSPITAL_COMMUNITY): Payer: Self-pay | Admitting: Pharmacy Technician

## 2013-12-12 ENCOUNTER — Encounter (HOSPITAL_COMMUNITY): Payer: Self-pay

## 2013-12-12 ENCOUNTER — Encounter (HOSPITAL_COMMUNITY)
Admission: RE | Admit: 2013-12-12 | Discharge: 2013-12-12 | Disposition: A | Discharge status: Home / Self Care | Payer: BC Managed Care – PPO | Source: Ambulatory Visit | Attending: Neurosurgery | Admitting: Neurosurgery

## 2013-12-12 ENCOUNTER — Other Ambulatory Visit (HOSPITAL_COMMUNITY): Payer: Self-pay | Admitting: *Deleted

## 2013-12-12 DIAGNOSIS — Z01812 Encounter for preprocedural laboratory examination: Secondary | ICD-10-CM | POA: Diagnosis not present

## 2013-12-12 DIAGNOSIS — M5126 Other intervertebral disc displacement, lumbar region: Secondary | ICD-10-CM | POA: Insufficient documentation

## 2013-12-12 LAB — CBC
HCT: 43 % (ref 39.0–52.0)
HEMOGLOBIN: 15.4 g/dL (ref 13.0–17.0)
MCH: 31.6 pg (ref 26.0–34.0)
MCHC: 35.8 g/dL (ref 30.0–36.0)
MCV: 88.1 fL (ref 78.0–100.0)
Platelets: 176 10*3/uL (ref 150–400)
RBC: 4.88 MIL/uL (ref 4.22–5.81)
RDW: 12.9 % (ref 11.5–15.5)
WBC: 14 10*3/uL — ABNORMAL HIGH (ref 4.0–10.5)

## 2013-12-12 LAB — BASIC METABOLIC PANEL
BUN: 16 mg/dL (ref 6–23)
CHLORIDE: 105 meq/L (ref 96–112)
CO2: 26 mEq/L (ref 19–32)
Calcium: 9.5 mg/dL (ref 8.4–10.5)
Creatinine, Ser: 1.07 mg/dL (ref 0.50–1.35)
GFR calc Af Amer: 83 mL/min — ABNORMAL LOW (ref >90)
GFR, EST NON AFRICAN AMERICAN: 71 mL/min — AB (ref >90)
GLUCOSE: 99 mg/dL (ref 70–99)
POTASSIUM: 4.9 meq/L (ref 3.7–5.3)
SODIUM: 144 meq/L (ref 137–147)

## 2013-12-12 LAB — SURGICAL PCR SCREEN
MRSA, PCR: NEGATIVE
Staphylococcus aureus: NEGATIVE

## 2013-12-12 NOTE — Progress Notes (Signed)
12/12/13 1023  OBSTRUCTIVE SLEEP APNEA  Have you ever been diagnosed with sleep apnea through a sleep study? No  Do you snore loudly (loud enough to be heard through closed doors)?  0  Do you often feel tired, fatigued, or sleepy during the daytime? 1  Has anyone observed you stop breathing during your sleep? 0  Do you have, or are you being treated for high blood pressure? 1  BMI more than 35 kg/m2? 0  Age over 64 years old? 1  Neck circumference greater than 40 cm/18 inches? (17)  Gender: 1  Obstructive Sleep Apnea Score 4  Score 4 or greater  Results sent to PCP

## 2013-12-12 NOTE — Pre-Procedure Instructions (Signed)
Grant Carter  12/12/2013   Your procedure is scheduled on:  Friday, December 16, 2013 at 7:30 AM.   Report to [facility] Entrance "A" Admitting Office at 5:30 AM.   Call this number if you have problems the morning of surgery: [phone]   Remember:   Do not eat food or drink liquids after midnight Thursday, 12/15/13.   Take these medicines the morning of surgery with A SIP OF WATER: cetirizine (ZYRTEC),  HYDROcodone-acetaminophen (NORCO) - if needed, omeprazole (PRILOSEC) - if needed.  Stop Vitamins, Selenium and Voltaren (Diclofenac) as of today. Do not take any NSAIDS (Ibuprofen, Aleve) prior to surgery.    Do not wear jewelry.  Do not wear lotions, powders, or cologne. You may wear deodorant.  Men may shave face and neck.  Do not bring valuables to the hospital.  [facility] is not responsible                  for any belongings or valuables.               Contacts, dentures or bridgework may not be worn into surgery.  Leave suitcase in the car. After surgery it may be brought to your room.  For patients admitted to the hospital, discharge time is determined by your                treatment team.               Special Instructions: Shower using the CHG soap the night prior to your surgery and again the morning of your surgery.    Please read over the following fact sheets that you were given: Pain Booklet, Coughing and Deep Breathing, MRSA Information and Surgical Site Infection Prevention

## 2013-12-12 NOTE — Progress Notes (Signed)
Pre-op orders not signed in EPIC. Called Dr. Windy Carina office and left message with Lorriane Shire.

## 2013-12-15 MED ORDER — CEFAZOLIN SODIUM-DEXTROSE 2-3 GM-% IV SOLR
2.0000 g | INTRAVENOUS | Status: AC
  Administered 2013-12-16: 2 g via INTRAVENOUS
  Filled 2013-12-15: qty 50

## 2013-12-16 ENCOUNTER — Ambulatory Visit (HOSPITAL_COMMUNITY): Payer: BC Managed Care – PPO

## 2013-12-16 ENCOUNTER — Observation Stay (HOSPITAL_COMMUNITY)
Admission: RE | Admit: 2013-12-16 | Discharge: 2013-12-16 | Disposition: A | Discharge status: Home / Self Care | Payer: BC Managed Care – PPO | Source: Ambulatory Visit | Attending: Neurosurgery | Admitting: Neurosurgery

## 2013-12-16 ENCOUNTER — Encounter (HOSPITAL_COMMUNITY): Payer: BC Managed Care – PPO | Admitting: Anesthesiology

## 2013-12-16 ENCOUNTER — Ambulatory Visit (HOSPITAL_COMMUNITY): Payer: BC Managed Care – PPO | Admitting: Anesthesiology

## 2013-12-16 ENCOUNTER — Encounter (HOSPITAL_COMMUNITY)
Admission: RE | Disposition: A | Discharge status: Home / Self Care | Payer: Self-pay | Source: Ambulatory Visit | Attending: Neurosurgery

## 2013-12-16 ENCOUNTER — Encounter (HOSPITAL_COMMUNITY): Payer: Self-pay | Admitting: *Deleted

## 2013-12-16 DIAGNOSIS — I10 Essential (primary) hypertension: Secondary | ICD-10-CM | POA: Insufficient documentation

## 2013-12-16 DIAGNOSIS — M5126 Other intervertebral disc displacement, lumbar region: Principal | ICD-10-CM | POA: Diagnosis present

## 2013-12-16 DIAGNOSIS — C9511 Chronic leukemia of unspecified cell type, in remission: Secondary | ICD-10-CM | POA: Insufficient documentation

## 2013-12-16 DIAGNOSIS — Z79899 Other long term (current) drug therapy: Secondary | ICD-10-CM | POA: Insufficient documentation

## 2013-12-16 DIAGNOSIS — K219 Gastro-esophageal reflux disease without esophagitis: Secondary | ICD-10-CM | POA: Insufficient documentation

## 2013-12-16 DIAGNOSIS — G709 Myoneural disorder, unspecified: Secondary | ICD-10-CM | POA: Insufficient documentation

## 2013-12-16 HISTORY — PX: LUMBAR LAMINECTOMY/DECOMPRESSION MICRODISCECTOMY: SHX5026

## 2013-12-16 SURGERY — LUMBAR LAMINECTOMY/DECOMPRESSION MICRODISCECTOMY 1 LEVEL
Anesthesia: General | Site: Spine Lumbar | Laterality: Left

## 2013-12-16 MED ORDER — EPHEDRINE SULFATE 50 MG/ML IJ SOLN
INTRAMUSCULAR | Status: DC | PRN
  Administered 2013-12-16 (×2): 10 mg via INTRAVENOUS

## 2013-12-16 MED ORDER — HEMOSTATIC AGENTS (NO CHARGE) OPTIME
TOPICAL | Status: DC | PRN
  Administered 2013-12-16: 1 via TOPICAL

## 2013-12-16 MED ORDER — ROCURONIUM BROMIDE 50 MG/5ML IV SOLN
INTRAVENOUS | Status: AC
  Filled 2013-12-16: qty 1

## 2013-12-16 MED ORDER — ACETAMINOPHEN 650 MG RE SUPP: 650.0000 mg | RECTAL | Status: DC | PRN

## 2013-12-16 MED ORDER — OXYCODONE-ACETAMINOPHEN 5-325 MG PO TABS: 1.0000 | ORAL_TABLET | ORAL | Status: DC | PRN

## 2013-12-16 MED ORDER — CEFAZOLIN SODIUM 1-5 GM-% IV SOLN
1.0000 g | Freq: Three times a day (TID) | INTRAVENOUS | Status: DC
  Administered 2013-12-16: 1 g via INTRAVENOUS
  Filled 2013-12-16 (×2): qty 50

## 2013-12-16 MED ORDER — MIDAZOLAM HCL 2 MG/2ML IJ SOLN
INTRAMUSCULAR | Status: AC
  Filled 2013-12-16: qty 2

## 2013-12-16 MED ORDER — PROPOFOL 10 MG/ML IV BOLUS
INTRAVENOUS | Status: DC | PRN
  Administered 2013-12-16: 200 mg via INTRAVENOUS

## 2013-12-16 MED ORDER — GLYCOPYRROLATE 0.2 MG/ML IJ SOLN
INTRAMUSCULAR | Status: DC | PRN
  Administered 2013-12-16: 0.6 mg via INTRAVENOUS

## 2013-12-16 MED ORDER — DEXAMETHASONE SODIUM PHOSPHATE 10 MG/ML IJ SOLN
10.0000 mg | INTRAMUSCULAR | Status: AC
  Administered 2013-12-16: 10 mg via INTRAVENOUS
  Filled 2013-12-16: qty 1

## 2013-12-16 MED ORDER — HYDROCODONE-ACETAMINOPHEN 10-325 MG PO TABS
1.0000 | ORAL_TABLET | Freq: Three times a day (TID) | ORAL | Status: DC | PRN
  Administered 2013-12-16: 1 via ORAL
  Filled 2013-12-16: qty 1

## 2013-12-16 MED ORDER — SODIUM CHLORIDE 0.9 % IR SOLN
Status: DC | PRN
  Administered 2013-12-16: 08:00:00

## 2013-12-16 MED ORDER — LIDOCAINE HCL (CARDIAC) 20 MG/ML IV SOLN
INTRAVENOUS | Status: DC | PRN
  Administered 2013-12-16: 80 mg via INTRAVENOUS

## 2013-12-16 MED ORDER — NEOSTIGMINE METHYLSULFATE 1 MG/ML IJ SOLN
INTRAMUSCULAR | Status: AC
  Filled 2013-12-16: qty 10

## 2013-12-16 MED ORDER — SODIUM CHLORIDE 0.9 % IV SOLN: 250.0000 mL | INTRAVENOUS | Status: DC

## 2013-12-16 MED ORDER — LORATADINE 10 MG PO TABS
10.0000 mg | ORAL_TABLET | Freq: Every day | ORAL | Status: DC
  Administered 2013-12-16: 10 mg via ORAL
  Filled 2013-12-16: qty 1

## 2013-12-16 MED ORDER — EPHEDRINE SULFATE 50 MG/ML IJ SOLN
INTRAMUSCULAR | Status: AC
  Filled 2013-12-16: qty 1

## 2013-12-16 MED ORDER — FENTANYL CITRATE 0.05 MG/ML IJ SOLN
INTRAMUSCULAR | Status: DC | PRN
Start: 2013-12-16 — End: 2013-12-16
  Administered 2013-12-16 (×2): 25 ug via INTRAVENOUS
  Administered 2013-12-16: 100 ug via INTRAVENOUS

## 2013-12-16 MED ORDER — LACTATED RINGERS IV SOLN
INTRAVENOUS | Status: DC | PRN
  Administered 2013-12-16 (×2): via INTRAVENOUS

## 2013-12-16 MED ORDER — SELENIUM 200 MCG PO CAPS: 200.0000 ug | ORAL_CAPSULE | Freq: Every day | ORAL | Status: DC

## 2013-12-16 MED ORDER — ONDANSETRON HCL 4 MG/2ML IJ SOLN: 4.0000 mg | INTRAMUSCULAR | Status: DC | PRN

## 2013-12-16 MED ORDER — CYCLOBENZAPRINE HCL 10 MG PO TABS
10.0000 mg | ORAL_TABLET | Freq: Three times a day (TID) | ORAL | Status: DC | PRN
  Administered 2013-12-16: 10 mg via ORAL
  Filled 2013-12-16: qty 1

## 2013-12-16 MED ORDER — PROMETHAZINE HCL 25 MG/ML IJ SOLN: 6.2500 mg | INTRAMUSCULAR | Status: DC | PRN

## 2013-12-16 MED ORDER — PHENYLEPHRINE 40 MCG/ML (10ML) SYRINGE FOR IV PUSH (FOR BLOOD PRESSURE SUPPORT)
PREFILLED_SYRINGE | INTRAVENOUS | Status: AC
  Filled 2013-12-16: qty 10

## 2013-12-16 MED ORDER — BUPIVACAINE HCL (PF) 0.25 % IJ SOLN
INTRAMUSCULAR | Status: DC | PRN
  Administered 2013-12-16: 9 mL

## 2013-12-16 MED ORDER — THROMBIN 5000 UNITS EX SOLR
CUTANEOUS | Status: DC | PRN
  Administered 2013-12-16 (×2): 5000 [IU] via TOPICAL

## 2013-12-16 MED ORDER — LIDOCAINE HCL (CARDIAC) 20 MG/ML IV SOLN
INTRAVENOUS | Status: AC
  Filled 2013-12-16: qty 5

## 2013-12-16 MED ORDER — DOCUSATE SODIUM 100 MG PO CAPS
100.0000 mg | ORAL_CAPSULE | Freq: Two times a day (BID) | ORAL | Status: DC
  Administered 2013-12-16: 100 mg via ORAL
  Filled 2013-12-16 (×2): qty 1

## 2013-12-16 MED ORDER — ONDANSETRON HCL 4 MG/2ML IJ SOLN
INTRAMUSCULAR | Status: AC
  Filled 2013-12-16: qty 4

## 2013-12-16 MED ORDER — HYDROMORPHONE HCL PF 1 MG/ML IJ SOLN: 0.2500 mg | INTRAMUSCULAR | Status: DC | PRN

## 2013-12-16 MED ORDER — LISINOPRIL 10 MG PO TABS
10.0000 mg | ORAL_TABLET | Freq: Every day | ORAL | Status: DC
  Administered 2013-12-16: 10 mg via ORAL
  Filled 2013-12-16: qty 1

## 2013-12-16 MED ORDER — SODIUM CHLORIDE 0.9 % IJ SOLN: 3.0000 mL | Freq: Two times a day (BID) | INTRAMUSCULAR | Status: DC

## 2013-12-16 MED ORDER — DICLOFENAC SODIUM 75 MG PO TBEC
75.0000 mg | DELAYED_RELEASE_TABLET | Freq: Two times a day (BID) | ORAL | Status: DC | PRN
  Filled 2013-12-16: qty 1

## 2013-12-16 MED ORDER — ROCURONIUM BROMIDE 100 MG/10ML IV SOLN
INTRAVENOUS | Status: DC | PRN
  Administered 2013-12-16: 10 mg via INTRAVENOUS
  Administered 2013-12-16: 50 mg via INTRAVENOUS

## 2013-12-16 MED ORDER — OXYCODONE HCL 5 MG/5ML PO SOLN: 5.0000 mg | Freq: Once | ORAL | Status: DC | PRN

## 2013-12-16 MED ORDER — ACETAMINOPHEN 325 MG PO TABS: 650.0000 mg | ORAL_TABLET | ORAL | Status: DC | PRN

## 2013-12-16 MED ORDER — LIDOCAINE-EPINEPHRINE 1 %-1:100000 IJ SOLN
INTRAMUSCULAR | Status: DC | PRN
  Administered 2013-12-16: 10 mL

## 2013-12-16 MED ORDER — MENTHOL 3 MG MT LOZG
1.0000 | LOZENGE | OROMUCOSAL | Status: DC | PRN
  Filled 2013-12-16: qty 9

## 2013-12-16 MED ORDER — GLYCOPYRROLATE 0.2 MG/ML IJ SOLN
INTRAMUSCULAR | Status: AC
  Filled 2013-12-16: qty 3

## 2013-12-16 MED ORDER — PHENOL 1.4 % MT LIQD
1.0000 | OROMUCOSAL | Status: DC | PRN
  Administered 2013-12-16: 1 via OROMUCOSAL
  Filled 2013-12-16: qty 177

## 2013-12-16 MED ORDER — FENTANYL CITRATE 0.05 MG/ML IJ SOLN
INTRAMUSCULAR | Status: AC
  Filled 2013-12-16: qty 5

## 2013-12-16 MED ORDER — ONDANSETRON HCL 4 MG/2ML IJ SOLN
INTRAMUSCULAR | Status: DC | PRN
  Administered 2013-12-16: 4 mg via INTRAVENOUS

## 2013-12-16 MED ORDER — MIDAZOLAM HCL 5 MG/5ML IJ SOLN
INTRAMUSCULAR | Status: DC | PRN
  Administered 2013-12-16: 2 mg via INTRAVENOUS

## 2013-12-16 MED ORDER — 0.9 % SODIUM CHLORIDE (POUR BTL) OPTIME
TOPICAL | Status: DC | PRN
  Administered 2013-12-16: 1000 mL

## 2013-12-16 MED ORDER — HYDROMORPHONE HCL PF 1 MG/ML IJ SOLN
0.5000 mg | INTRAMUSCULAR | Status: DC | PRN
  Administered 2013-12-16: 1 mg via INTRAVENOUS
  Filled 2013-12-16: qty 1

## 2013-12-16 MED ORDER — SODIUM CHLORIDE 0.9 % IJ SOLN: 3.0000 mL | INTRAMUSCULAR | Status: DC | PRN

## 2013-12-16 MED ORDER — OXYCODONE HCL 5 MG PO TABS: 5.0000 mg | ORAL_TABLET | Freq: Once | ORAL | Status: DC | PRN

## 2013-12-16 MED ORDER — PANTOPRAZOLE SODIUM 40 MG PO TBEC
40.0000 mg | DELAYED_RELEASE_TABLET | Freq: Every day | ORAL | Status: DC
  Administered 2013-12-16: 40 mg via ORAL
  Filled 2013-12-16: qty 1

## 2013-12-16 MED ORDER — ROCURONIUM BROMIDE 50 MG/5ML IV SOLN
INTRAVENOUS | Status: AC
  Filled 2013-12-16: qty 2

## 2013-12-16 MED ORDER — SELENIUM 50 MCG PO TABS
200.0000 ug | ORAL_TABLET | Freq: Every day | ORAL | Status: DC
  Administered 2013-12-16: 200 ug via ORAL
  Filled 2013-12-16: qty 4

## 2013-12-16 MED ORDER — PROPOFOL 10 MG/ML IV BOLUS
INTRAVENOUS | Status: AC
Start: 2013-12-16 — End: 2013-12-16
  Filled 2013-12-16: qty 20

## 2013-12-16 MED ORDER — STERILE WATER FOR INJECTION IJ SOLN
INTRAMUSCULAR | Status: AC
  Filled 2013-12-16: qty 10

## 2013-12-16 MED ORDER — NEOSTIGMINE METHYLSULFATE 1 MG/ML IJ SOLN
INTRAMUSCULAR | Status: DC | PRN
  Administered 2013-12-16: 4 mg via INTRAVENOUS

## 2013-12-16 MED ORDER — PHENYLEPHRINE HCL 10 MG/ML IJ SOLN
INTRAMUSCULAR | Status: DC | PRN
  Administered 2013-12-16: 40 ug via INTRAVENOUS
  Administered 2013-12-16 (×2): 80 ug via INTRAVENOUS

## 2013-12-16 SURGICAL SUPPLY — 58 items
BAG DECANTER FOR FLEXI CONT (MISCELLANEOUS) ×3 IMPLANT
BENZOIN TINCTURE PRP APPL 2/3 (GAUZE/BANDAGES/DRESSINGS) ×3 IMPLANT
BLADE SURG 11 STRL SS (BLADE) ×3 IMPLANT
BLADE SURG ROTATE 9660 (MISCELLANEOUS) IMPLANT
BRUSH SCRUB EZ PLAIN DRY (MISCELLANEOUS) ×3 IMPLANT
BUR MATCHSTICK NEURO 3.0 LAGG (BURR) ×3 IMPLANT
BUR PRECISION FLUTE 6.0 (BURR) ×3 IMPLANT
CANISTER SUCT 3000ML (MISCELLANEOUS) ×3 IMPLANT
CLOSURE WOUND 1/2 X4 (GAUZE/BANDAGES/DRESSINGS) ×1
CONT SPEC 4OZ CLIKSEAL STRL BL (MISCELLANEOUS) ×3 IMPLANT
DECANTER SPIKE VIAL GLASS SM (MISCELLANEOUS) ×3 IMPLANT
DERMABOND ADHESIVE PROPEN (GAUZE/BANDAGES/DRESSINGS) ×2
DERMABOND ADVANCED (GAUZE/BANDAGES/DRESSINGS)
DERMABOND ADVANCED .7 DNX12 (GAUZE/BANDAGES/DRESSINGS) IMPLANT
DERMABOND ADVANCED .7 DNX6 (GAUZE/BANDAGES/DRESSINGS) ×1 IMPLANT
DRAPE LAPAROTOMY 100X72X124 (DRAPES) ×3 IMPLANT
DRAPE MICROSCOPE ZEISS OPMI (DRAPES) ×3 IMPLANT
DRAPE POUCH INSTRU U-SHP 10X18 (DRAPES) ×3 IMPLANT
DRAPE PROXIMA HALF (DRAPES) IMPLANT
DRAPE SURG 17X23 STRL (DRAPES) ×3 IMPLANT
DRSG OPSITE 4X5.5 SM (GAUZE/BANDAGES/DRESSINGS) IMPLANT
DRSG OPSITE POSTOP 4X6 (GAUZE/BANDAGES/DRESSINGS) ×3 IMPLANT
DURAPREP 26ML APPLICATOR (WOUND CARE) ×3 IMPLANT
ELECT REM PT RETURN 9FT ADLT (ELECTROSURGICAL) ×3
ELECTRODE REM PT RTRN 9FT ADLT (ELECTROSURGICAL) ×1 IMPLANT
GAUZE SPONGE 4X4 16PLY XRAY LF (GAUZE/BANDAGES/DRESSINGS) IMPLANT
GLOVE BIO SURGEON STRL SZ8 (GLOVE) ×3 IMPLANT
GLOVE BIOGEL PI IND STRL 8.5 (GLOVE) ×1 IMPLANT
GLOVE BIOGEL PI INDICATOR 8.5 (GLOVE) ×2
GLOVE ECLIPSE 8.5 STRL (GLOVE) ×3 IMPLANT
GLOVE EXAM NITRILE LRG STRL (GLOVE) IMPLANT
GLOVE EXAM NITRILE MD LF STRL (GLOVE) IMPLANT
GLOVE EXAM NITRILE XL STR (GLOVE) IMPLANT
GLOVE EXAM NITRILE XS STR PU (GLOVE) IMPLANT
GLOVE INDICATOR 8.5 STRL (GLOVE) ×3 IMPLANT
GOWN BRE IMP SLV AUR LG STRL (GOWN DISPOSABLE) ×3 IMPLANT
GOWN BRE IMP SLV AUR XL STRL (GOWN DISPOSABLE) ×6 IMPLANT
GOWN STRL REIN 2XL LVL4 (GOWN DISPOSABLE) ×3 IMPLANT
KIT BASIN OR (CUSTOM PROCEDURE TRAY) ×3 IMPLANT
KIT ROOM TURNOVER OR (KITS) ×3 IMPLANT
NEEDLE HYPO 22GX1.5 SAFETY (NEEDLE) ×3 IMPLANT
NEEDLE SPNL 22GX3.5 QUINCKE BK (NEEDLE) ×3 IMPLANT
NS IRRIG 1000ML POUR BTL (IV SOLUTION) ×3 IMPLANT
PACK LAMINECTOMY NEURO (CUSTOM PROCEDURE TRAY) ×3 IMPLANT
RUBBERBAND STERILE (MISCELLANEOUS) ×6 IMPLANT
SPONGE GAUZE 4X4 12PLY (GAUZE/BANDAGES/DRESSINGS) ×3 IMPLANT
SPONGE SURGIFOAM ABS GEL 12-7 (HEMOSTASIS) IMPLANT
SPONGE SURGIFOAM ABS GEL SZ50 (HEMOSTASIS) ×3 IMPLANT
STRIP CLOSURE SKIN 1/2X4 (GAUZE/BANDAGES/DRESSINGS) ×2 IMPLANT
SUT BONE WAX W31G (SUTURE) IMPLANT
SUT VIC AB 0 CT1 18XCR BRD8 (SUTURE) ×1 IMPLANT
SUT VIC AB 0 CT1 8-18 (SUTURE) ×2
SUT VIC AB 2-0 CT1 18 (SUTURE) ×3 IMPLANT
SUT VICRYL 4-0 PS2 18IN ABS (SUTURE) ×3 IMPLANT
SYR 20ML ECCENTRIC (SYRINGE) ×3 IMPLANT
TOWEL OR 17X24 6PK STRL BLUE (TOWEL DISPOSABLE) ×3 IMPLANT
TOWEL OR 17X26 10 PK STRL BLUE (TOWEL DISPOSABLE) ×3 IMPLANT
WATER STERILE IRR 1000ML POUR (IV SOLUTION) ×3 IMPLANT

## 2013-12-16 NOTE — Anesthesia Preprocedure Evaluation (Signed)
Anesthesia Evaluation  Patient identified by MRN, date of birth, ID band Patient awake    Reviewed: Allergy & Precautions, H&P   Airway Mallampati: II      Dental   Pulmonary former smoker,  breath sounds clear to auscultation        Cardiovascular hypertension, Rhythm:Regular Rate:Normal     Neuro/Psych    GI/Hepatic GERD-  ,  Endo/Other    Renal/GU      Musculoskeletal   Abdominal   Peds  Hematology  (+) Blood dyscrasia, ,   Anesthesia Other Findings   Reproductive/Obstetrics                           Anesthesia Physical Anesthesia Plan  ASA: III  Anesthesia Plan: General   Post-op Pain Management:    Induction: Intravenous  Airway Management Planned: Oral ETT  Additional Equipment:   Intra-op Plan:   Post-operative Plan: Extubation in OR  Informed Consent: I have reviewed the patients History and Physical, chart, labs and discussed the procedure including the risks, benefits and alternatives for the proposed anesthesia with the patient or authorized representative who has indicated his/her understanding and acceptance.     Plan Discussed with:   Anesthesia Plan Comments:         Anesthesia Quick Evaluation

## 2013-12-16 NOTE — Transfer of Care (Signed)
Immediate Anesthesia Transfer of Care Note  Patient: Grant Carter  Procedure(s) Performed: Procedure(s): LUMBAR LAMINECTOMY/DECOMPRESSION MICRODISCECTOMY 1 LEVEL  two/three (Left)  Patient Location: PACU  Anesthesia Type:General  Level of Consciousness: sedated  Airway & Oxygen Therapy: Patient Spontanous Breathing and Patient connected to nasal cannula oxygen  Post-op Assessment: Report given to PACU RN, Post -op Vital signs reviewed and stable and Patient moving all extremities  Post vital signs: Reviewed and stable  Complications: No apparent anesthesia complications

## 2013-12-16 NOTE — Anesthesia Postprocedure Evaluation (Signed)
  Anesthesia Post-op Note  Patient: Grant Carter  Procedure(s) Performed: Procedure(s): LUMBAR LAMINECTOMY/DECOMPRESSION MICRODISCECTOMY 1 LEVEL  two/three (Left)  Patient Location: PACU  Anesthesia Type:General  Level of Consciousness: awake  Airway and Oxygen Therapy: Patient Spontanous Breathing  Post-op Pain: mild  Post-op Assessment: Post-op Vital signs reviewed  Post-op Vital Signs: stable  Complications: No apparent anesthesia complications

## 2013-12-16 NOTE — Preoperative (Signed)
Beta Blockers   Reason not to administer Beta Blockers:Not Applicable 

## 2013-12-16 NOTE — Op Note (Signed)
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  EBL: Minimal  History of present illness: Patient is a very pleasant 63 year old gentleman who had a disc herniation L2-3 on the left initially did very well however several weeks to start strengthening at worsening left hip and anterior quad pain consistent with a recurrent left L3 radiculopathy workup revealed a large recurrent disc herniation at L2-3 on the left patient had progression of clinical syndrome had some significant pain limited weakness in left lower extremity is failed all forms of conservative treatments are recommended a reexploration and redo laminectomy discectomy at this level I extensively went over the risks and benefits of the operation the patient as well as perioperative course and expectations of outcome and alternatives of surgery he understands and agrees to proceed forward.  Operative procedure: Patient brought into the or was induced under general anesthesia positioned prone the Wilson frame his back was prepped and draped in routine sterile fashion,  so after infiltration 10 cc lidocaine with epi a midline incision was made through the old incision and subperiosteal dissection was carried out through the scar tissue exposing the laminotomy defect L2-3. Interoperative x-ray confirmed the appropriate level so than the inferior aspect of the residual lamina of L2-1 super aspect the residual lamina of L3 was drilled down the scar tissues dissected off of the undersurface of the laminar edge and and laminotomy was extended cephalad caudally with a 2 and may Kerrison punch. Then working out laterally underneath the inferior aspect and medial aspect of the facet complex I  freed up the scar tissue March 9 to light and identify the L3 pedicle. The L3 nerve root was then dissected off of the L3 pedicle and reflected medially and then working superiorly up underneath the L3 nerve root a plane was developed to expose a very large recurrent disc herniation at the level of disc space. The space and cleaned out several arthritis removed there is some additional free fragments 3 of the medial aspect of the pedicle. Several large forms were also removed Monday the facet complex and the lateral aspect of the disc space and out the disc spaces cleanout in the undersurface of the L3 nerve root was freed up from the inferior fragments the thecal sac and L3 nerve root was easily mobilizable and the foramen was widely patent and easily excepting a coronary dilator and hockey-stick. It was in to see her get meticulous hemostasis was maintained I also to palpate the L2 foramen to confirm patency removal lateral disc and then Gelfoam was opened up the dura the muscle fascia pressure layers with after Vicryl the skin was closed with a running 4 subcuticular benzoin and Steri-Strips were applied patient recovered in stable condition. At the end of case on it counts sponge counts were correct.

## 2013-12-16 NOTE — Progress Notes (Signed)
Pt doing well. Pt has ambulated, voided, and ate all without difficulty. Pt and wife given D/C instructions with verbal understanding. Pt D/C'd home via wheelchair @ 1920 per MD order. Holli Humbles, RN

## 2013-12-16 NOTE — H&P (Signed)
Grant Carter is an 63 y.o. male.   Chief Complaint: Back and left leg pain HPI: Patient is a very pleasant 63 year old [place] who had a ruptured disc at L2-3 underwent laminectomy microdiscectomy did very well the first several weeks then start dressing recurrent left hip and leg pain rating down L3 distribution again underwent repeat MRI scan which shows a large recurrent disc herniation with severe lateral recess stenosis at L2-3 on the left and due to patient's progression of clinical syndrome imaging findings and failure conservative treatment I recommended a reexploration and redo lumbar laminectomy microdiscectomy at this level I extensively went over the risks and benefits of the operation the patient as well as perioperative course and expectations of outcome alternatives surgery he understands and agrees to proceed forward.  Past Medical History  Diagnosis Date  . Hypertension     treated for fluctuating BP  . GERD (gastroesophageal reflux disease)   . Neuromuscular disorder     nerve pain, numbness in left leg/foot  . Arthritis   . 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 Verner Chol)    Past Surgical History  Procedure Laterality Date  . Hip surgery      as a child  . Rotator cuff repair Right 09/2012  . Eye surgery Left     cataract  . Lymph node biopsy Right   . Lumbar laminectomy/decompression microdiscectomy Left 07/01/2013    Procedure: LUMBAR LAMINECTOMY/DECOMPRESSION MICRODISCECTOMY 1 LEVEL;  Surgeon: Elaina Hoops, MD;  Location: [facility];  Service: Neurosurgery;  Laterality: Left;  Lumbar Laminectomy and Discectomy Lumbar Two-Three     History reviewed. No pertinent family history. Social History:  reports that he has quit smoking. He has never used smokeless tobacco. He reports that he drinks alcohol. He reports that he does not use illicit drugs.  Allergies: No Known Allergies  Medications Prior to Admission  Medication Sig  Dispense Refill  . cetirizine (ZYRTEC) 10 MG tablet Take 10 mg by mouth daily.      . diclofenac (VOLTAREN) 75 MG EC tablet Take 75 mg by mouth 2 (two) times daily as needed (pain).      Marland Kitchen HYDROcodone-acetaminophen (NORCO) 10-325 MG per tablet Take 1 tablet by mouth 3 (three) times daily as needed for pain.      Marland Kitchen lisinopril (PRINIVIL,ZESTRIL) 10 MG tablet Take 10 mg by mouth daily.      . Multiple Vitamin (ANTIOXIDANT A/C/E PO) Take 1 tablet by mouth daily.      Marland Kitchen omeprazole (PRILOSEC) 40 MG capsule Take 40 mg by mouth daily as needed (heartburn).      . Selenium 200 MCG CAPS Take 200 mcg by mouth daily.        No results found for this or any previous visit (from the past 48 hour(s)). No results found.  Review of Systems  Constitutional: Negative.   HENT: Negative.   Eyes: Negative.   Respiratory: Negative.   Cardiovascular: Positive for PND.  Gastrointestinal: Negative.   Genitourinary: Negative.   Musculoskeletal: Positive for back pain and myalgias.  Skin: Negative.   Neurological: Positive for tingling and sensory change.  Endo/Heme/Allergies: Negative.   Psychiatric/Behavioral: Negative.     Blood pressure 110/64, pulse 62, temperature 97.1 F (36.2 C), temperature source Oral, resp. rate 18, SpO2 98.00%. Physical Exam  Constitutional: He is oriented to person, place, and time. He appears well-developed and well-nourished.  HENT:  Head: Normocephalic and atraumatic.  Eyes: Pupils are equal, round, and reactive to light.  Neck: Normal range of motion.  Respiratory: Effort normal.  GI: Soft. Bowel sounds are normal.  Neurological: He is alert and oriented to person, place, and time. He has normal strength. GCS eye subscore is 4. GCS verbal subscore is 5. GCS motor subscore is 6.  Reflex Scores:      Patellar reflexes are 0 on the right side and 0 on the left side.      Achilles reflexes are 0 on the right side and 0 on the left side. Strength is 5 out of 5 in his  iliopsoas, quads, hamstrings, gastrocs, into tibialis, EHL. He does have low the pain limited weakness predominantly in the left quad and the hip flexor     Assessment/Plan 63 years and presents for an L2-3 laminectomy microdiscectomy redo  Carlia Bomkamp P 12/16/2013, 7:18 AM

## 2013-12-16 NOTE — Anesthesia Procedure Notes (Signed)
Procedure Name: Intubation Date/Time: 12/16/2013 7:31 AM Performed by: Trixie Deis A Pre-anesthesia Checklist: Patient identified, Timeout performed, Emergency Drugs available, Suction available and Patient being monitored Patient Re-evaluated:Patient Re-evaluated prior to inductionOxygen Delivery Method: Circle system utilized Preoxygenation: Pre-oxygenation with 100% oxygen Intubation Type: IV induction Ventilation: Mask ventilation without difficulty and Oral airway inserted - appropriate to patient size Laryngoscope Size: Mac and 4 Grade View: Grade II Tube type: Oral Tube size: 7.5 mm Number of attempts: 1 Airway Equipment and Method: Stylet Placement Confirmation: ETT inserted through vocal cords under direct vision,  breath sounds checked- equal and bilateral and positive ETCO2 Secured at: 23 cm Tube secured with: Tape Dental Injury: Teeth and Oropharynx as per pre-operative assessment

## 2013-12-19 ENCOUNTER — Encounter (HOSPITAL_COMMUNITY): Payer: Self-pay | Admitting: Neurosurgery

## 2013-12-19 NOTE — Discharge Summary (Addendum)
  Physician Discharge Summary  Patient ID: Grant Carter MRN: [id number] DOB/AGE: 1951/01/31 63 y.o.  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 Postoperatively there will work on the floor on the floor he was inlet voiding spontaneously tolerating regular diet in stable for discharge home he'll be discharged 1-2 weeks.  Consults: Significant Diagnostic Studies: Treatments: Lumbar laminectomy microdiscectomy L2-3 Discharge Exam: Blood pressure 131/78, pulse 77, temperature 98.6 F (37 C), temperature source Oral, resp. rate 16, SpO2 94.00%. Strength 5 out of 5 wound clean and dry  Disposition: Home     Medication List         ANTIOXIDANT A/C/E PO  Take 1 tablet by mouth daily.     cetirizine 10 MG tablet  Commonly known as:  ZYRTEC  Take 10 mg by mouth daily.     diclofenac 75 MG EC tablet  Commonly known as:  VOLTAREN  Take 75 mg by mouth 2 (two) times daily as needed (pain).     HYDROcodone-acetaminophen 10-325 MG per tablet  Commonly known as:  NORCO  Take 1 tablet by mouth 3 (three) times daily as needed for pain.     lisinopril 10 MG tablet  Commonly known as:  PRINIVIL,ZESTRIL  Take 10 mg by mouth daily.     omeprazole 40 MG capsule  Commonly known as:  PRILOSEC  Take 40 mg by mouth daily as needed (heartburn).     Selenium 200 MCG Caps  Take 200 mcg by mouth daily.         Signed: Price Lachapelle P 12/19/2013, 1:26 PM

## 2016-02-28 ENCOUNTER — Other Ambulatory Visit: Payer: Self-pay | Admitting: Neurosurgery

## 2016-02-28 DIAGNOSIS — D1800 Hemangioma unspecified site: Secondary | ICD-10-CM

## 2016-03-11 ENCOUNTER — Other Ambulatory Visit: Payer: Self-pay | Admitting: Neurosurgery

## 2016-03-11 DIAGNOSIS — M5023 Other cervical disc displacement, cervicothoracic region: Secondary | ICD-10-CM

## 2016-03-20 ENCOUNTER — Other Ambulatory Visit: Payer: Self-pay

## 2016-03-25 ENCOUNTER — Ambulatory Visit
Admission: RE | Admit: 2016-03-25 | Discharge: 2016-03-25 | Disposition: A | Discharge status: Home / Self Care | Payer: Medicare Other | Source: Ambulatory Visit | Attending: Neurosurgery | Admitting: Neurosurgery

## 2016-03-25 DIAGNOSIS — D1800 Hemangioma unspecified site: Secondary | ICD-10-CM

## 2016-03-25 DIAGNOSIS — M5023 Other cervical disc displacement, cervicothoracic region: Secondary | ICD-10-CM

## 2016-03-25 MED ORDER — GADOBENATE DIMEGLUMINE 529 MG/ML IV SOLN
20.0000 mL | Freq: Once | INTRAVENOUS | Status: AC | PRN
  Administered 2016-03-25: 20 mL via INTRAVENOUS

## 2016-04-03 ENCOUNTER — Telehealth: Payer: Self-pay | Admitting: *Deleted

## 2016-04-03 NOTE — Telephone Encounter (Signed)
Message For: OFFICE               Taken 25-MAY-17 at  9:19AM by [facility] ------------------------------------------------------------ Grant Carter             CID [id number]  Patient Grant Carter        Pt's Dr Jannifer Franklin       Area Code 336 Phone# [phone] * DOB [birth year] [address] THIS MORNING        NOT SURE WHAT THIS IS ABOUT, OR WHO CALLED           Disp:Y/N N If Y = C/B If No Response In 37minutes ============================================================ I called pt, unable to leave a message.

## 2016-04-08 ENCOUNTER — Ambulatory Visit (INDEPENDENT_AMBULATORY_CARE_PROVIDER_SITE_OTHER): Payer: Medicare Other | Admitting: Neurology

## 2016-04-08 ENCOUNTER — Encounter: Payer: Self-pay | Admitting: Neurology

## 2016-04-08 VITALS — BP 132/78 | HR 60 | Ht 71.0 in | Wt 214.0 lb

## 2016-04-08 DIAGNOSIS — R5382 Chronic fatigue, unspecified: Secondary | ICD-10-CM | POA: Diagnosis not present

## 2016-04-08 DIAGNOSIS — E538 Deficiency of other specified B group vitamins: Secondary | ICD-10-CM

## 2016-04-08 DIAGNOSIS — H532 Diplopia: Secondary | ICD-10-CM

## 2016-04-08 HISTORY — DX: Diplopia: H53.2

## 2016-04-08 NOTE — Progress Notes (Signed)
Reason for visit: Double vision  Referring physician: [name] is a 65 y.o. male  History of present illness:  Grant Carter is a 65 year old right-handed white male with a history of onset of double vision that occurred about one month ago. The patient had been evaluated for severe headache, he had MRI brain evaluation, and shortly thereafter began having confusion, later developing shaking chills and fever. The double vision began around this time. The patient also felt slightly confused with a fever. The headaches and fever have since gone. The patient has noted double vision that has a vertical and horizontal component that is most severe when he looks to the left and down. If he tilts his head to the left, the images come closer together. The patient denies any other symptoms of numbness, weakness of the extremities or face. He denies any slurred speech, difficulty swallowing, or loss of vision. The patient has some mild gait instability with the double vision. He denies any issues controlling the bowels or the bladder. The headache again has gone away, there is no neck stiffness. 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 The patient has had MRI of the cervical spine, significant lymph node enlargement was seen associated with his history of chronic lymphocytic leukemia. He comes to this office for an evaluation.  Past Medical History  Diagnosis Date  . Hypertension     treated for fluctuating BP  . GERD (gastroesophageal reflux disease)   . Neuromuscular disorder (HCC)     nerve pain, numbness in left leg/foot  . Arthritis   . Chronic leukemia in remission ([name])     with IGG immune deficiency; CLL (Dr. Verner Chol)  . Diplopia 04/08/2016    Past Surgical History  Procedure Laterality Date  . Hip surgery      as a child  . Rotator cuff repair Right 09/2012  . Eye surgery Left     cataract    . Lymph node biopsy Right   . Lumbar laminectomy/decompression microdiscectomy Left 07/01/2013    Procedure: LUMBAR LAMINECTOMY/DECOMPRESSION MICRODISCECTOMY 1 LEVEL;  Surgeon: Elaina Hoops, MD;  Location: [facility];  Service: Neurosurgery;  Laterality: Left;  Lumbar Laminectomy and Discectomy Lumbar Two-Three   . Lumbar laminectomy/decompression microdiscectomy Left 12/16/2013    Procedure: LUMBAR LAMINECTOMY/DECOMPRESSION MICRODISCECTOMY 1 LEVEL  two/three;  Surgeon: Elaina Hoops, MD;  Location: [facility];  Service: Neurosurgery;  Laterality: Left;    Family History  Problem Relation Age of Onset  . Heart disease Mother   . Heart disease Father     Social history:  reports that he has quit smoking. He has never used smokeless tobacco. He reports that he drinks alcohol. He reports that he does not use illicit drugs.  Medications:  Prior to Admission medications   Medication Sig Start Date End Date Taking? Authorizing Provider  albuterol (PROAIR HFA) 108 (90 Base) MCG/ACT inhaler Inhale into the lungs. 02/18/16  Yes Historical Provider, MD  cetirizine (ZYRTEC) 10 MG tablet Take 10 mg by mouth daily.   Yes Historical Provider, MD  hydrocortisone 2.5 % cream  02/18/16  Yes Historical Provider, MD  lisinopril (PRINIVIL,ZESTRIL) 10 MG tablet Take 10 mg by mouth daily.   Yes Historical Provider, MD  Multiple Vitamin (ANTIOXIDANT A/C/E PO) Take 1 tablet by mouth daily.   Yes Historical Provider, MD  omeprazole (PRILOSEC) 40 MG capsule Take  40 mg by mouth daily as needed (heartburn).   Yes Historical Provider, MD  Probiotic Product (PROBIOTIC PO) Take by mouth.   Yes Historical Provider, MD  Selenium 200 MCG CAPS Take 200 mcg by mouth daily.   Yes Historical Provider, MD     No Known Allergies  ROS:  Out of a complete 14 system review of symptoms, the patient complains only of the following symptoms, and all other reviewed systems are negative.  Double vision  Blood pressure 132/78,  pulse 60, height 5\' 11"  (1.803 m), weight 214 lb (97.07 kg).  Physical Exam  General: The patient is alert and cooperative at the time of the examination. The patient is moderately obese.  Eyes: Pupils are equal, round, and reactive to light. Discs are flat bilaterally.  Neck: The neck is supple, no carotid bruits are noted.  Respiratory: The respiratory examination is clear.  Cardiovascular: The cardiovascular examination reveals a regular rate and rhythm, no obvious murmurs or rubs are noted.  Skin: Extremities are without significant edema.  Neurologic Exam  Mental status: The patient is alert and oriented x 3 at the time of the examination. The patient has apparent normal recent and remote memory, with an apparently normal attention span and concentration ability.  Cranial nerves: Facial symmetry is present. There is good sensation of the face to pinprick and soft touch bilaterally. The strength of the facial muscles and the muscles to head turning and shoulder shrug are normal bilaterally. Speech is well enunciated, no aphasia or dysarthria is noted. Extraocular movements are full. Visual fields are full. The tongue is midline, and the patient has symmetric elevation of the soft palate. No obvious hearing deficits are noted.  Motor: The motor testing reveals 5 over 5 strength of all 4 extremities. Good symmetric motor tone is noted throughout.  Sensory: Sensory testing is intact to pinprick, soft touch, vibration sensation, and position sense on all 4 extremities, with exception of a stocking distribution pinprick sensory deficit 2/3 the way up the legs below the knees, and some decrease in the position sense of the feet bilaterally. No evidence of extinction is noted.  Coordination: Cerebellar testing reveals good finger-nose-finger and heel-to-shin bilaterally.  Gait and station: Gait is normal. Tandem gait is normal. Romberg is negative. No drift is seen.  Reflexes: Deep tendon  reflexes are symmetric and normal bilaterally. Toes are downgoing bilaterally.   Assessment/Plan:  1. 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 History of CLL  The patient does have a history of hypertension which if severe pain can cause a fourth cranial nerve palsy. The patient indicates that his blood pressure has been under good control. He will be sent for further blood work looking for an etiology of his double vision. He will follow-up in 2 months. The patient may improve gradually as time goes on. Onset of the deficit was one month ago. He is to see Dr. Sanda Klein in the future.  Jill Alexanders MD 04/08/2016 2:58 PM  [facility] [address] [place], [place] [address]  Phone [phone] Fax [phone]

## 2016-04-09 LAB — RHEUMATOID FACTOR

## 2016-04-09 LAB — ACETYLCHOLINE RECEPTOR, BINDING

## 2016-04-09 LAB — RPR: RPR: NONREACTIVE

## 2016-04-09 LAB — SEDIMENTATION RATE: Sed Rate: 2 mm/hr (ref 0–30)

## 2016-04-09 LAB — VITAMIN B12: Vitamin B-12: 353 pg/mL (ref 211–946)

## 2016-04-09 LAB — ANA W/REFLEX: ANA: NEGATIVE

## 2016-04-09 LAB — TSH: TSH: 2.65 u[IU]/mL (ref 0.450–4.500)

## 2016-04-09 LAB — ANGIOTENSIN CONVERTING ENZYME

## 2016-04-10 ENCOUNTER — Telehealth: Payer: Self-pay

## 2016-04-10 NOTE — Telephone Encounter (Signed)
-----   Message from Kathrynn Ducking, MD sent at 04/09/2016  4:27 PM EDT -----  The blood work results are unremarkable. Please call the patient.  ----- Message -----    From: [facility] Lab Results In Interface    Sent: 04/09/2016   7:42 AM      To: Kathrynn Ducking, MD

## 2016-04-10 NOTE — Telephone Encounter (Signed)
Called pt w/ unremarkable lab results and spoke w/ his wife. Verbalized understanding and appreciation for call.

## 2016-06-11 ENCOUNTER — Ambulatory Visit: Payer: Medicare Other | Admitting: Neurology

## 2016-07-22 ENCOUNTER — Ambulatory Visit: Payer: Medicare Other | Admitting: Neurology

## 2018-05-16 IMAGING — MR MR CERVICAL SPINE W/O CM
4 of 5 series · 27 of 48 positions shown · non-contrast
Comparison: None.

CLINICAL DATA: Neck pain with an electric sensation in the right
foot. No known injury. Initial encounter.

EXAM:
MRI CERVICAL SPINE WITHOUT CONTRAST
TECHNIQUE: Multiplanar, multisequence MR imaging of the cervical spine was
performed. No intravenous contrast was administered.

[Series 6: T1 · sagittal · 3.0mm · 0.66mm/px · 6 of 15 slices shown]
[im 1/15]
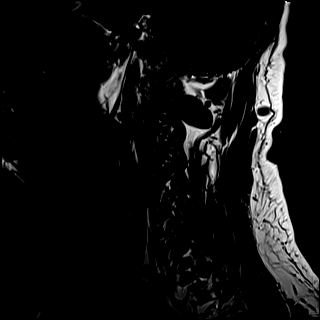
[im 3/15]
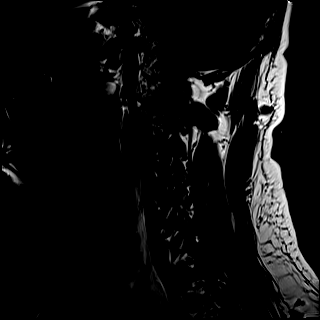
[im 6/15]
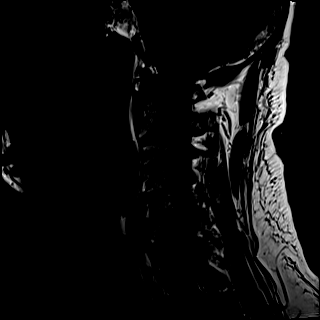
[im 9/15]
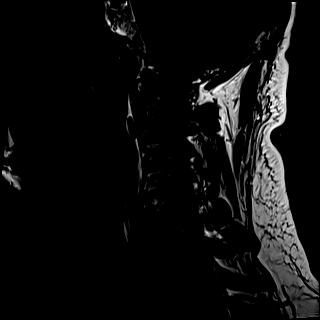
[im 12/15]
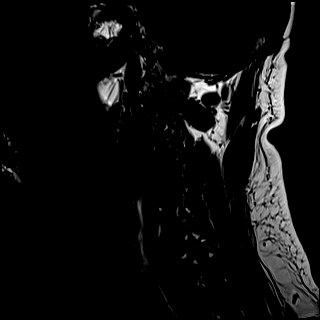
[im 15/15]
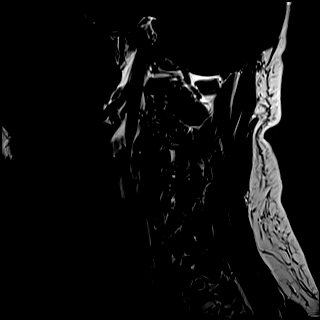

[Series 7: T2 · sagittal · 3.0mm · 0.55mm/px · 6 of 15 slices shown (1 of 2)]
[im 1/15]
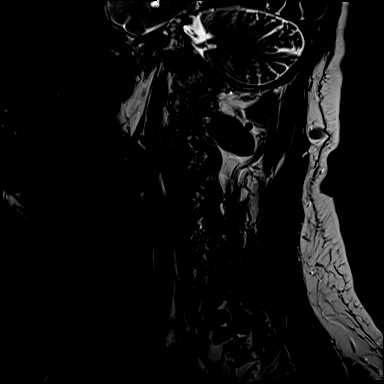
[im 3/15]
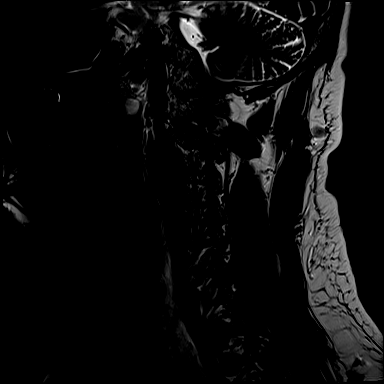
[im 6/15]
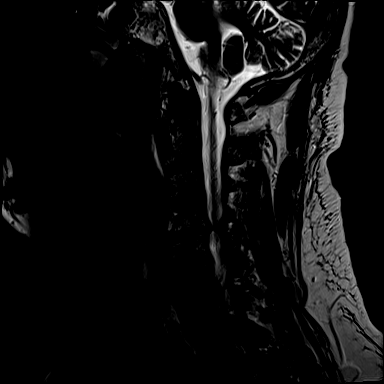
[im 9/15]
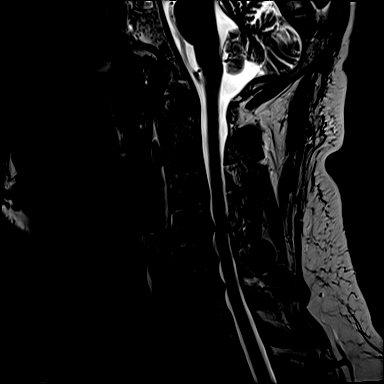
[im 12/15]
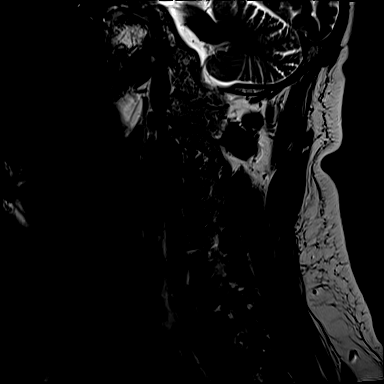
[im 15/15]
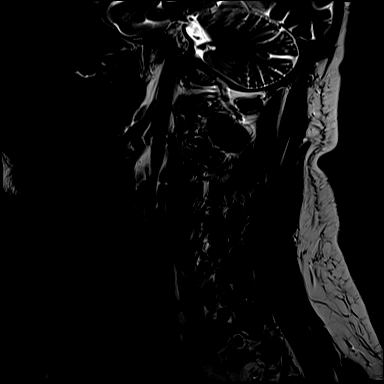

[Series 8: STIR · sagittal · 3.0mm · 0.33mm/px · 6 of 15 slices shown]
[im 1/15]
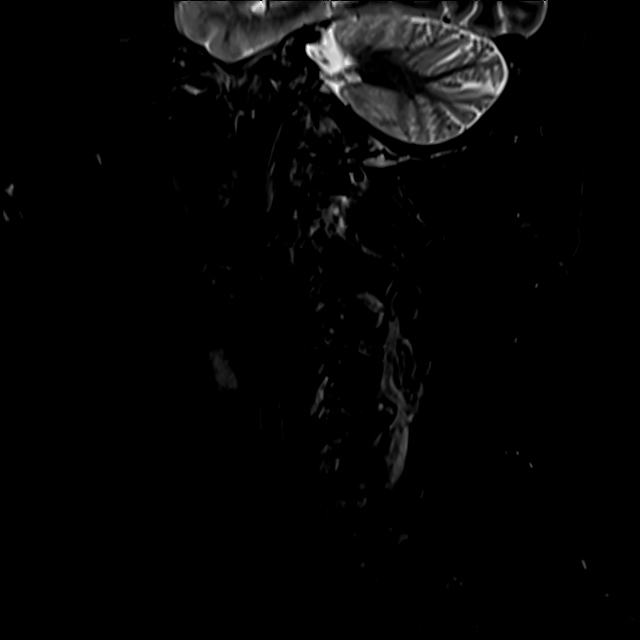
[im 3/15]
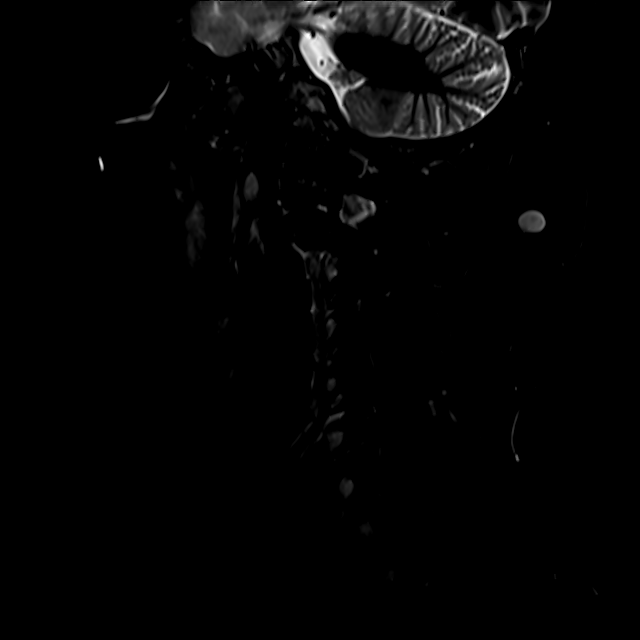
[im 6/15]
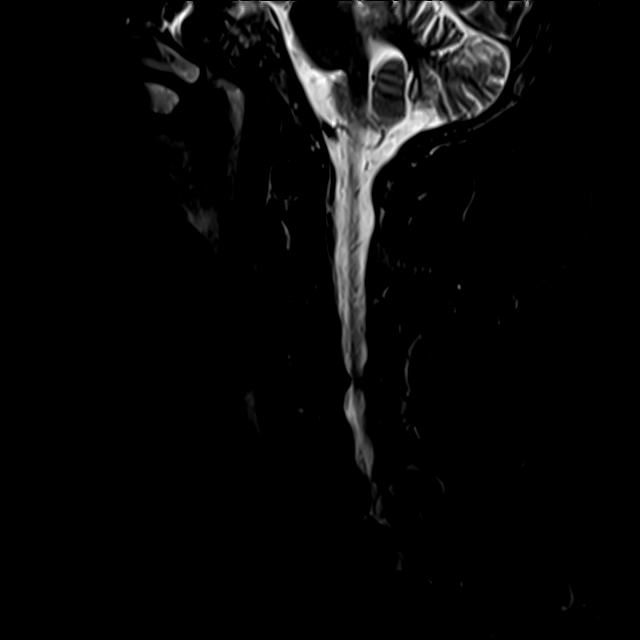
[im 9/15]
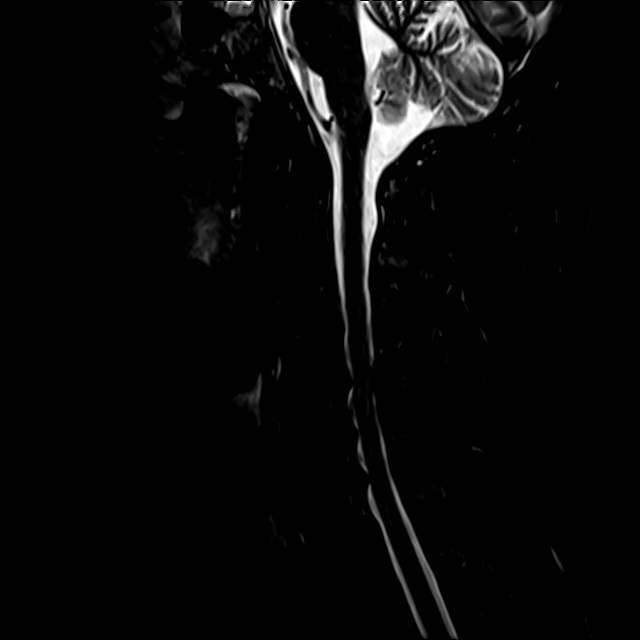
[im 12/15]
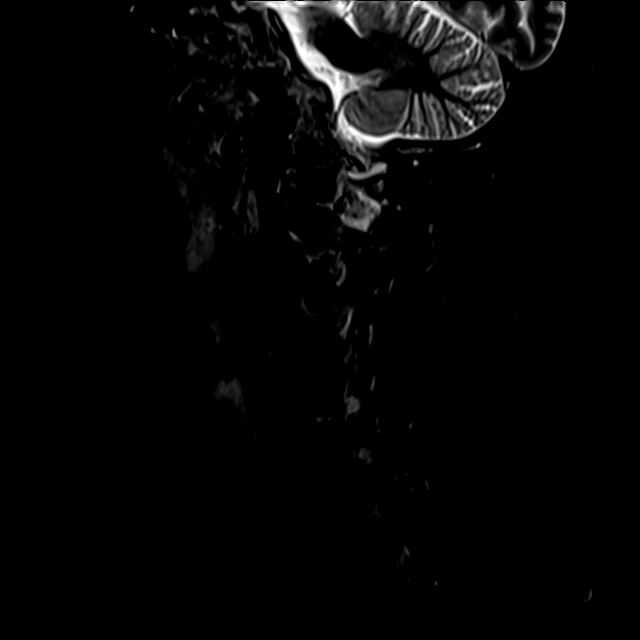
[im 15/15]
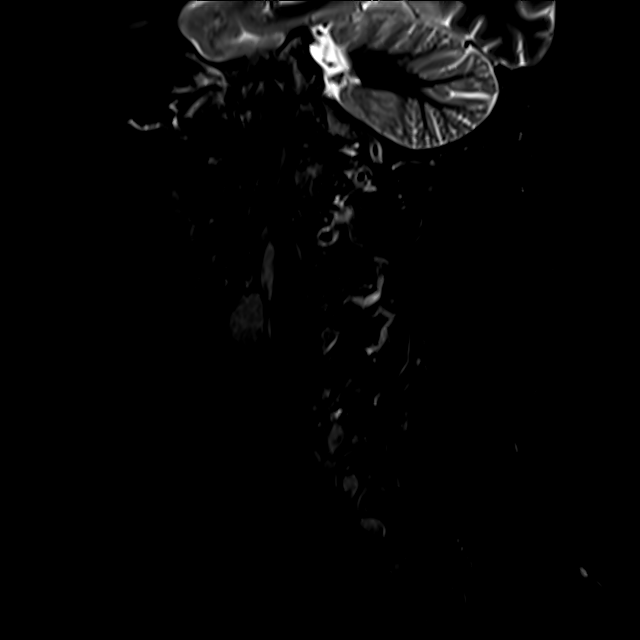

[Series 9: T2 · axial · 3.0mm · 0.50mm/px · z∈[-244,-134]mm · 9 of 35 slices shown (2 of 2)]
[im 1/35]
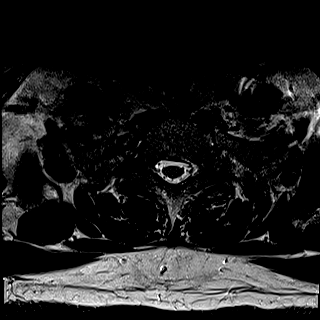
[im 5/35]
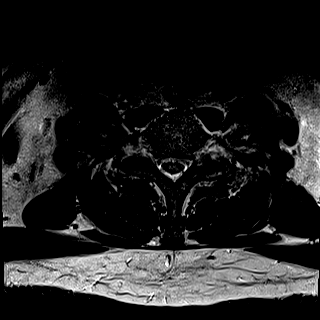
[im 10/35]
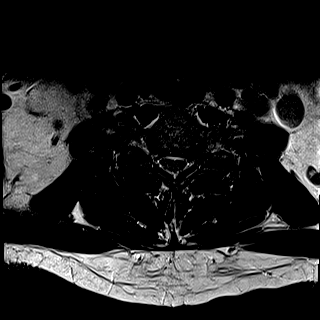
[im 15/35]
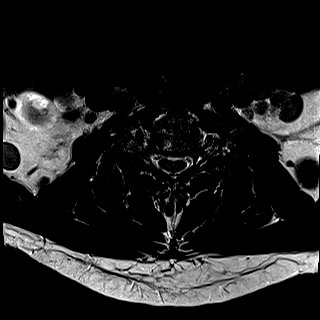
[im 18/35]
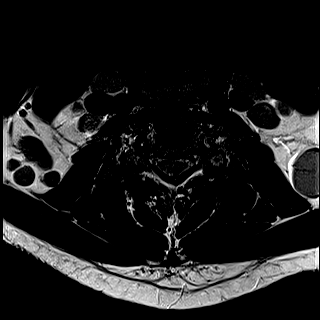
[im 20/35]
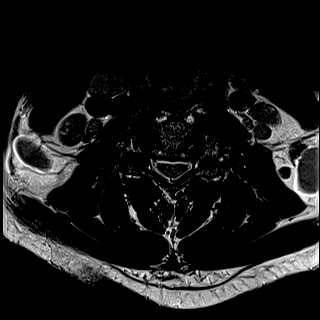
[im 25/35]
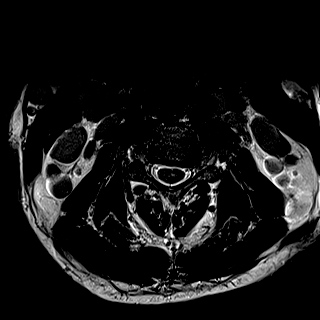
[im 30/35]
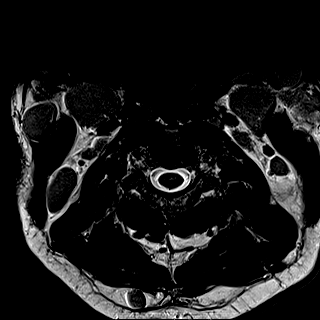
[im 35/35]
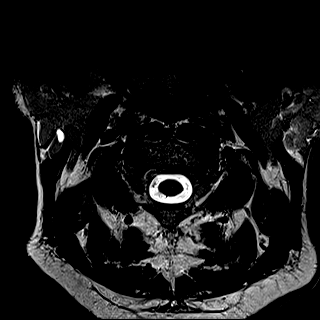

[27 of 48 positions shown; findings below may reference images not displayed]

FINDINGS: There is extensive lymphadenopathy throughout the neck. Index
supraclavicular node on the right measures 2.2 cm in diameter on
image 23 of series 9. Node anterior to the right
sternocleidomastoid, likely a level 2 node, on image 6 of series 9
measures 2.1 cm in diameter. Innumerable enlarged lymph nodes are
present throughout the neck.

Marrow signal is mildly heterogeneous. Vertebral body height is
maintained. Trace retrolisthesis C4 on C5 is identified. The
craniocervical junction is normal. Cervical cord signal is normal.

C2-3:  Negative.

C3-4: Mild posterior bony ridging and uncovertebral disease on the
left. The central canal and foramina are open.

C4-5: Disc osteophyte complex mildly flattens the ventral cord.
Uncovertebral disease causes moderately severe bilateral foraminal
narrowing.

C5-6: Shallow disc bulge nearly effaces the ventral thecal sac. The
foramina are open.

C6-7: Diffuse disc bulge slightly flattens the ventral cord.
Uncovertebral disease causes moderate to moderately severe foraminal
narrowing, worse on the left.

C7-T1: Minimal disc bulge without central canal or foraminal
narrowing.
IMPRESSION: Dominant finding is extensive pathologic lymphadenopathy throughout
the neck highly suspicious for lymphoproliferative disease such as
lymphoma. Given the lymphadenopathy, heterogeneous marrow pattern is
worrisome for marrow infiltration by tumor.

Disc osteophyte complex at C4-5 slightly flattens the ventral cord
and uncovertebral disease causes moderately severe bilateral
foraminal narrowing.

Slight flattening of the ventral cord and moderate to moderately
severe bilateral foraminal narrowing C6-7. Foraminal narrowing
appears somewhat worse on the left.

Findings of this report will be called to the ordering clinician
with report of the patient's brain MRI which was performed the same
day and read by Dr. Silveria Coleman.
